# Patient Record
Sex: Male | Born: 1935 | Race: White | Hispanic: No | Marital: Married | Smoking: Former smoker
Health system: Southern US, Community
[De-identification: ages and names within clinical notes are randomized; demographics above are authoritative.]

## PROBLEM LIST (undated history)

## (undated) DIAGNOSIS — I714 Abdominal aortic aneurysm, without rupture, unspecified: Secondary | ICD-10-CM

## (undated) DIAGNOSIS — G479 Sleep disorder, unspecified: Secondary | ICD-10-CM

## (undated) DIAGNOSIS — I495 Sick sinus syndrome: Secondary | ICD-10-CM

## (undated) DIAGNOSIS — I4891 Unspecified atrial fibrillation: Secondary | ICD-10-CM

## (undated) DIAGNOSIS — I499 Cardiac arrhythmia, unspecified: Secondary | ICD-10-CM

## (undated) DIAGNOSIS — I10 Essential (primary) hypertension: Secondary | ICD-10-CM

## (undated) DIAGNOSIS — E7800 Pure hypercholesterolemia, unspecified: Secondary | ICD-10-CM

## (undated) HISTORY — PX: TIBIA FRACTURE SURGERY: SHX806

## (undated) HISTORY — DX: Unspecified atrial fibrillation: I48.91

## (undated) HISTORY — DX: Abdominal aortic aneurysm, without rupture: I71.4

## (undated) HISTORY — DX: Abdominal aortic aneurysm, without rupture, unspecified: I71.40

## (undated) HISTORY — DX: Sleep disorder, unspecified: G47.9

## (undated) HISTORY — DX: Cardiac arrhythmia, unspecified: I49.9

## (undated) HISTORY — DX: Pure hypercholesterolemia, unspecified: E78.00

## (undated) HISTORY — PX: TONSILLECTOMY: SUR1361

## (undated) HISTORY — DX: Sick sinus syndrome: I49.5

## (undated) HISTORY — DX: Essential (primary) hypertension: I10

---
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
## 2003-01-05 ENCOUNTER — Encounter: Admission: RE | Admit: 2003-01-05 | Discharge: 2003-01-05 | Payer: Self-pay | Admitting: Neurosurgery

## 2003-01-05 ENCOUNTER — Encounter: Payer: Self-pay | Admitting: Neurosurgery

## 2003-01-23 ENCOUNTER — Encounter: Payer: Self-pay | Admitting: Neurosurgery

## 2003-01-23 ENCOUNTER — Encounter: Admission: RE | Admit: 2003-01-23 | Discharge: 2003-01-23 | Payer: Self-pay | Admitting: Neurosurgery

## 2003-02-09 ENCOUNTER — Encounter: Admission: RE | Admit: 2003-02-09 | Discharge: 2003-02-09 | Payer: Self-pay | Admitting: Neurosurgery

## 2003-02-09 ENCOUNTER — Encounter: Payer: Self-pay | Admitting: Neurosurgery

## 2007-11-08 ENCOUNTER — Ambulatory Visit: Payer: Self-pay | Admitting: Surgery

## 2007-12-13 ENCOUNTER — Encounter: Admission: RE | Admit: 2007-12-13 | Discharge: 2007-12-13 | Payer: Self-pay | Admitting: Surgery

## 2007-12-13 ENCOUNTER — Ambulatory Visit: Payer: Self-pay | Admitting: Surgery

## 2007-12-20 ENCOUNTER — Ambulatory Visit (HOSPITAL_COMMUNITY): Admission: RE | Admit: 2007-12-20 | Discharge: 2007-12-20 | Payer: Self-pay | Admitting: Ophthalmology

## 2008-05-01 ENCOUNTER — Ambulatory Visit (HOSPITAL_COMMUNITY): Admission: RE | Admit: 2008-05-01 | Discharge: 2008-05-01 | Payer: Self-pay | Admitting: Ophthalmology

## 2008-05-10 ENCOUNTER — Ambulatory Visit: Payer: Self-pay | Admitting: Surgery

## 2008-06-19 ENCOUNTER — Ambulatory Visit: Payer: Self-pay | Admitting: Surgery

## 2008-06-19 ENCOUNTER — Encounter: Admission: RE | Admit: 2008-06-19 | Discharge: 2008-06-19 | Payer: Self-pay | Admitting: Surgery

## 2008-12-11 ENCOUNTER — Ambulatory Visit: Payer: Self-pay | Admitting: Surgery

## 2009-06-11 ENCOUNTER — Encounter: Admission: RE | Admit: 2009-06-11 | Discharge: 2009-06-11 | Payer: Self-pay | Admitting: Surgery

## 2009-06-11 ENCOUNTER — Ambulatory Visit: Payer: Self-pay | Admitting: Surgery

## 2009-09-25 ENCOUNTER — Ambulatory Visit: Payer: Self-pay | Admitting: Cardiology

## 2009-09-26 ENCOUNTER — Encounter: Payer: Self-pay | Admitting: Cardiology

## 2009-09-28 ENCOUNTER — Encounter: Payer: Self-pay | Admitting: Cardiology

## 2009-10-04 ENCOUNTER — Encounter (INDEPENDENT_AMBULATORY_CARE_PROVIDER_SITE_OTHER): Payer: Self-pay | Admitting: *Deleted

## 2009-10-04 ENCOUNTER — Ambulatory Visit: Payer: Self-pay | Admitting: Internal Medicine

## 2009-10-04 ENCOUNTER — Encounter: Payer: Self-pay | Admitting: Cardiology

## 2009-10-04 DIAGNOSIS — I714 Abdominal aortic aneurysm, without rupture, unspecified: Secondary | ICD-10-CM | POA: Insufficient documentation

## 2009-10-04 DIAGNOSIS — I10 Essential (primary) hypertension: Secondary | ICD-10-CM | POA: Insufficient documentation

## 2009-10-05 ENCOUNTER — Telehealth (INDEPENDENT_AMBULATORY_CARE_PROVIDER_SITE_OTHER): Payer: Self-pay | Admitting: *Deleted

## 2009-10-10 ENCOUNTER — Telehealth: Payer: Self-pay | Admitting: Internal Medicine

## 2009-10-12 ENCOUNTER — Ambulatory Visit: Payer: Self-pay | Admitting: Cardiology

## 2009-10-15 ENCOUNTER — Encounter: Payer: Self-pay | Admitting: Cardiology

## 2009-10-17 ENCOUNTER — Encounter: Payer: Self-pay | Admitting: Cardiology

## 2009-10-23 ENCOUNTER — Encounter: Payer: Self-pay | Admitting: Internal Medicine

## 2009-10-31 ENCOUNTER — Telehealth (INDEPENDENT_AMBULATORY_CARE_PROVIDER_SITE_OTHER): Payer: Self-pay | Admitting: *Deleted

## 2009-11-28 ENCOUNTER — Ambulatory Visit: Payer: Self-pay | Admitting: Internal Medicine

## 2009-12-03 ENCOUNTER — Telehealth: Payer: Self-pay | Admitting: Internal Medicine

## 2009-12-05 ENCOUNTER — Ambulatory Visit: Payer: Self-pay | Admitting: Internal Medicine

## 2009-12-05 ENCOUNTER — Encounter: Payer: Self-pay | Admitting: Cardiology

## 2009-12-05 LAB — CONVERTED CEMR LAB
Basophils Relative: 0.7 % (ref 0.0–3.0)
Calcium: 9.7 mg/dL (ref 8.4–10.5)
Eosinophils Absolute: 0.1 10*3/uL (ref 0.0–0.7)
Eosinophils Relative: 2.4 % (ref 0.0–5.0)
Hemoglobin: 14.3 g/dL (ref 13.0–17.0)
INR: 1.1 — ABNORMAL HIGH (ref 0.8–1.0)
Lymphs Abs: 1.4 10*3/uL (ref 0.7–4.0)
MCHC: 32.8 g/dL (ref 30.0–36.0)
MCV: 102.2 fL — ABNORMAL HIGH (ref 78.0–100.0)
Monocytes Relative: 10.1 % (ref 3.0–12.0)
Neutro Abs: 3.7 10*3/uL (ref 1.4–7.7)
Neutrophils Relative %: 62.9 % (ref 43.0–77.0)
Platelets: 176 10*3/uL (ref 150.0–400.0)
Prothrombin Time: 11.3 s (ref 9.1–11.7)
RDW: 12.9 % (ref 11.5–14.6)
WBC: 5.8 10*3/uL (ref 4.5–10.5)

## 2009-12-06 ENCOUNTER — Encounter: Payer: Self-pay | Admitting: Internal Medicine

## 2009-12-07 ENCOUNTER — Telehealth: Payer: Self-pay | Admitting: Internal Medicine

## 2009-12-10 ENCOUNTER — Ambulatory Visit (HOSPITAL_COMMUNITY): Admission: RE | Admit: 2009-12-10 | Discharge: 2009-12-11 | Payer: Self-pay | Admitting: Internal Medicine

## 2009-12-10 ENCOUNTER — Ambulatory Visit: Payer: Self-pay | Admitting: Internal Medicine

## 2009-12-10 HISTORY — PX: PACEMAKER INSERTION: SHX728

## 2009-12-11 ENCOUNTER — Encounter: Payer: Self-pay | Admitting: Cardiology

## 2009-12-11 ENCOUNTER — Encounter: Payer: Self-pay | Admitting: Internal Medicine

## 2009-12-12 ENCOUNTER — Encounter: Payer: Self-pay | Admitting: Internal Medicine

## 2009-12-12 ENCOUNTER — Telehealth (INDEPENDENT_AMBULATORY_CARE_PROVIDER_SITE_OTHER): Payer: Self-pay | Admitting: *Deleted

## 2009-12-21 ENCOUNTER — Ambulatory Visit: Payer: Self-pay | Admitting: Internal Medicine

## 2010-01-21 ENCOUNTER — Ambulatory Visit: Payer: Self-pay | Admitting: Cardiology

## 2010-01-28 ENCOUNTER — Ambulatory Visit: Payer: Self-pay | Admitting: Surgery

## 2010-01-28 ENCOUNTER — Encounter: Admission: RE | Admit: 2010-01-28 | Discharge: 2010-01-28 | Payer: Self-pay | Admitting: Surgery

## 2010-03-15 ENCOUNTER — Telehealth (INDEPENDENT_AMBULATORY_CARE_PROVIDER_SITE_OTHER): Payer: Self-pay | Admitting: *Deleted

## 2010-05-10 ENCOUNTER — Ambulatory Visit: Payer: Self-pay | Admitting: Internal Medicine

## 2010-06-04 ENCOUNTER — Encounter: Payer: Self-pay | Admitting: Cardiology

## 2010-07-26 ENCOUNTER — Encounter: Admission: RE | Admit: 2010-07-26 | Discharge: 2010-07-26 | Payer: Self-pay | Admitting: Surgery

## 2010-07-29 ENCOUNTER — Ambulatory Visit: Payer: Self-pay | Admitting: Surgery

## 2010-07-29 ENCOUNTER — Encounter: Payer: Self-pay | Admitting: Cardiology

## 2010-07-29 ENCOUNTER — Encounter: Admission: RE | Admit: 2010-07-29 | Discharge: 2010-07-29 | Payer: Self-pay | Admitting: Surgery

## 2010-08-13 ENCOUNTER — Ambulatory Visit: Payer: Self-pay | Admitting: Cardiology

## 2010-12-31 NOTE — Procedures (Signed)
Summary: wound check   Current Medications (verified): 1)  Fluticasone Propionate 50 Mcg/act Susp (Fluticasone Propionate) .... Take As Directed 2)  Multivitamins  Tabs (Multiple Vitamin) .... Once Daily 3)  Diovan 320 Mg Tabs (Valsartan) .... Take One Tablet Pm 4)  Lipitor 10 Mg Tabs (Atorvastatin Calcium) .... Take One Tablet Once Daily 5)  Aspirin 81 Mg Tabs (Aspirin) .... Once Daily 6)  Hydrochlorothiazide 25 Mg Tabs (Hydrochlorothiazide) .... 1/2 By Mouth Daily  Allergies (verified): No Known Drug Allergies  PPM Specifications Following MD:  Hillis Range, MD     PPM Vendor:  [employer]onic     PPM Model Number:  ADDRL1     PPM Serial Number:  [id number] PPM DOI:  12/10/2009     PPM Implanting MD:  Sherryl Manges, MD  Lead 1    Location: RA     DOI: 12/10/2009     Model #: [id number]     Serial #: [id number]     Status: active Lead 2    Location: RV     DOI: 12/10/2009     Model #: [id number]     Serial #: [id number]     Status: active  Magnet Response Rate:  BOL 85 ERI  65  Indications:  Neurocardiogenic syncope    PPM Follow Up Remote Check?  No Battery Voltage:  2.79 V     Battery Est. Longevity:  9 YEARS     Pacer Dependent:  No       PPM Device Measurements Atrium  Amplitude: 2.8 mV, Impedance: 584 ohms, Threshold: 0.375 V at 0.4 msec Right Ventricle  Amplitude: 22.4 mV, Impedance: 549 ohms, Threshold: 0.5 V at 0.4 msec  Episodes MS Episodes:  0     Ventricular High Rate:  0     Atrial Pacing:  58.3%     Ventricular Pacing:  4%  Parameters Mode:  MVP (R)     Lower Rate Limit:  60     Upper Rate Limit:  130 Paced AV Delay:  150     Sensed AV Delay:  120 Next Cardiology Appt Due:  03/01/2010 Tech Comments:  Wound check appt today.  Steri-strips removed.  Wound without redness or edema.  Normal device function.  No changes made today.  ROV 3 months Dr Johney Frame in [place]. Gypsy Balsam RN BSN  December 21, 2009 10:02 AM

## 2010-12-31 NOTE — Progress Notes (Signed)
Summary: snow on monday, procedure   Phone Note Call from Patient Call back at [facility] Phone [phone]   Caller: Patient Reason for Call: Talk to Nurse Details for Reason: due to snow on monday, what would be the step regarding his procedure if it cancel.  Initial call taken by: Lorne Skeens,  December 07, 2009 12:44 PM  Follow-up for Phone Call        Called patient and left message on machine To advise pt that nothing changes at the hospital, Dr. [name] will be there and the lab will run as scheduled.  Follow-up by: Duncan Dull, RN, BSN,  December 07, 2009 2:14 PM

## 2010-12-31 NOTE — Assessment & Plan Note (Signed)
Summary: 3 MO FU -SRS   Visit Type:  Pacemaker check Primary Provider:  [name]  CC:  pacemaker check .  History of Present Illness: The patient presents today for routine electrophysiology followup. He reports doing very well since having his pacemaker implanted. The patient denies symptoms of palpitations, chest pain, shortness of breath, orthopnea, PND, lower extremity edema, dizziness, presyncope, syncope, or neurologic sequela. The patient is tolerating medications without difficulties and is otherwise without complaint today.   Preventive Screening-Counseling & Management  Alcohol-Tobacco     Smoking Status: quit     Year Started: 2010  Current Medications (verified): 1)  Fluticasone Propionate 50 Mcg/act Susp (Fluticasone Propionate) .... Take As Directed 2)  Multivitamins  Tabs (Multiple Vitamin) .... Once Daily 3)  Diovan 320 Mg Tabs (Valsartan) .... Take One Tablet Pm 4)  Lipitor 10 Mg Tabs (Atorvastatin Calcium) .... Take One Tablet Once Daily 5)  Aspirin 81 Mg Tabs (Aspirin) .... Once Daily 6)  Hydrochlorothiazide 25 Mg Tabs (Hydrochlorothiazide) .... 1/2 By Mouth Daily  Allergies (verified): No Known Drug Allergies  Comments:  Nurse/Medical Assistant: The patient's medications were reviewed with the patient and were updated in the Medication List.  Past History:  Past Medical History:  Hypertension.   Hypercholesterolemia.   Cardiac arrhythmia. Prob ANVRT AAA Sleep disordered breathing  sinus node dysfunction  Past Surgical History:  Appendectomy.  tonsillecomy leg fracture repair  pacemaker implantation 1/11 by Dr [name]  Vital Signs:  Patient profile:   75 year old male Height:      70 inches Weight:      202.8 pounds O2 Sat:      97 % on Room air Pulse rate:   77 / minute BP sitting:   98 / 65  (left arm)  Vitals Entered By: Youlanda Mighty RN (May 10, 2010 3:32 PM)  O2 Flow:  Room air CC: pacemaker check    Physical Exam  General:   Well developed, well nourished, in no acute distress. Head:  normocephalic and atraumatic Eyes:  PERRLA/EOM intact; conjunctiva and lids normal. Mouth:  Teeth, gums and palate normal. Oral mucosa normal. Neck:  supple Chest Wall:  well healed Lungs:  clear Heart:  RRR Abdomen:  Bowel sounds positive; abdomen soft and non-tender without masses, organomegaly, or hernias noted. No hepatosplenomegaly. Msk:  Back normal, normal gait. Muscle strength and tone normal. Pulses:  pulses normal in all 4 extremities Extremities:  No clubbing or cyanosis. Neurologic:  Alert and oriented x 3.   PPM Specifications Following MD:  Hillis Range, MD     PPM Vendor:  [employer]tronic     PPM Model Number:  ADDRL1     PPM Serial Number:  [id number] PPM DOI:  12/10/2009     PPM Implanting MD:  Sherryl Manges, MD  Lead 1    Location: RA     DOI: 12/10/2009     Model #: [id number]     Serial #: [id number]     Status: active Lead 2    Location: RV     DOI: 12/10/2009     Model #: [id number]     Serial #: [id number]     Status: active  Magnet Response Rate:  BOL 85 ERI  65  Indications:  Neurocardiogenic syncope    PPM Follow Up Remote Check?  No Battery Voltage:  2.79 V     Battery Est. Longevity:  14 years     Pacer Dependent:  No  PPM Device Measurements Atrium  Amplitude: 2.8 mV, Impedance: 532 ohms, Threshold: 0.375 V at 0.4 msec Right Ventricle  Amplitude: 22.40 mV, Impedance: 557 ohms, Threshold: 0.625 V at 0.4 msec  Episodes MS Episodes:  109     Percent Mode Switch:  <0.1%     Coumadin:  No Ventricular High Rate:  10     Atrial Pacing:  51.6%     Ventricular Pacing:  3.3%  Parameters Mode:  MVP (R)     Lower Rate Limit:  60     Upper Rate Limit:  130 Paced AV Delay:  150     Sensed AV Delay:  120 Next Cardiology Appt Due:  12/01/2010 Tech Comments:  No parameter changes. Device function normal.  10 VHR episodes all 6 seconds or less.  ROV 1/12 with Dr. Johney Frame in [place]. Altha Harm, LPN  May 10, 2010  3:48 PM  MD Comments:  agree  Impression & Recommendations:  Problem # 1:  SINOATRIAL NODE DYSFUNCTION (ICD-427.81) normal pacemaker function no changes today  Patient Instructions: 1)  return 1/12

## 2010-12-31 NOTE — Cardiovascular Report (Signed)
Summary: Pre Op Orders  Pre Op Orders   Imported By: Roderic Ovens 12/21/2009 16:12:31  _____________________________________________________________________  External Attachment:    Type:   Image     Comment:   External Document

## 2010-12-31 NOTE — Miscellaneous (Signed)
Summary: Device preload  Clinical Lists Changes  Observations: Added new observation of PPM INDICATN: Neurocardiogenic syncope  (12/12/2009 11:50) Added new observation of MAGNET RTE: BOL 85 ERI  65 (12/12/2009 11:50) Added new observation of PPMLEADSTAT2: active (12/12/2009 11:50) Added new observation of PPMLEADSER2: [id number] (12/12/2009 11:50) Added new observation of PPMLEADMOD2: 6948NI (12/12/2009 11:50) Added new observation of PPMLEADLOC2: RV (12/12/2009 11:50) Added new observation of PPMLEADSTAT1: active (12/12/2009 11:50) Added new observation of PPMLEADSER1: [id number] (12/12/2009 11:50) Added new observation of PPMLEADMOD1: 5076  (12/12/2009 11:50) Added new observation of PPMLEADLOC1: RA  (12/12/2009 11:50) Added new observation of PPM IMP MD: Sherryl Manges, MD  (12/12/2009 11:50) Added new observation of PPMLEADDOI2: 12/10/2009  (12/12/2009 11:50) Added new observation of PPMLEADDOI1: 12/10/2009  (12/12/2009 11:50) Added new observation of PPM DOI: 12/10/2009  (12/12/2009 11:50) Added new observation of PPM SERL#: [id number]  (12/12/2009 11:50) Added new observation of PPM MODL#: ADDRL1  (12/12/2009 69:67) Added new observation of PACEMAKERMFG: [employer]tr[employer]  (12/12/2009 11:50) Added new observation of PACEMAKER MD: Sherryl Manges, MD  (12/12/2009 11:50)      PPM Specifications Following MD:  Sherryl Manges, MD     PPM Vendor:  [employer]     PPM Model Number:  ADDRL1     PPM Serial Number:  [id number] H PPM DOI:  12/10/2009     PPM Implanting MD:  Sherryl Manges, MD  Lead 1    Location: RA     DOI: 12/10/2009     Model #: 1025     Serial #: [id number]     Status: active Lead 2    Location: RV     DOI: 12/10/2009     Model #: [id number]     Serial #: [id number]     Status: active  Magnet Response Rate:  BOL 85 ERI  65  Indications:  Neurocardiogenic syncope

## 2010-12-31 NOTE — Progress Notes (Signed)
Summary: pacer question  Phone Note Call from Patient   Caller: wife - [place] Summary of Call: Had pacemaker placed on Monday by Dr. [name].  Now having slight swelling in left hand and wrist.  No other complaints.   Hoover Brunette, LPN  December 12, 2009 3:05 PM   Follow-up for Phone Call        Spoke with patient, some mild swelling in hand around knuckles and wrist area.  Pt is moving arm but not raising it.  He has an appt in [place] device clinic next Friday 1-21.  Pt advised to monitor swelling, to move arm as much as allowed, and to call back if swelling progresses.  Pt aware and agrees with plan. Gypsy Balsam RN BSN  December 12, 2009 3:33 PM

## 2010-12-31 NOTE — Letter (Signed)
Summary: External Correspondence/ OFFICE VISIT VASCULAR & VEIN  External Correspondence/ OFFICE VISIT VASCULAR & VEIN   Imported By: Dorise Hiss 08/26/2010 12:00:52  _____________________________________________________________________  External Attachment:    Type:   Image     Comment:   External Document

## 2010-12-31 NOTE — Cardiovascular Report (Signed)
Summary: Card Device Clinic/ FINAL REPORT  Card Device Clinic/ FINAL REPORT   Imported By: Dorise Hiss 05/14/2010 15:03:38  _____________________________________________________________________  External Attachment:    Type:   Image     Comment:   External Document

## 2010-12-31 NOTE — Assessment & Plan Note (Signed)
Summary: [facility] PER FAX 6 WK FU -SRS   Visit Type:  hospital follow-up Primary Provider:  [name]  CC:  hospital follow-up visit.  History of Present Illness: the patient is 75 year old male with a history of bradycardia incidentally noted during colonoscopy. However in followup the patient had significant pauses and marked bradycardia. The patient was referred for a pacemaker. Dr. [name] initially felt that this was related alterations in autonomic tone due to sleep apnea. However a [employer] study  was rather unremarkable and there was certainly no no evidence that this was the main cause for the bradycardia. the patient occasionally has mild shortness of breath. He also reports a stiffness in his left neck area which is intermittent but not suggestive of angina. It also appears to be positional in state that this occurred after his pacemaker implantation. He takes Tylenol his symptoms improve.  The patient is followed by Dr. [name] for aortic aneurysm which is approaching surgical repair size. Is not clear whether the patient will benefit from an endograft versus an open surgical procedure. Apparently is in the process of having this evaluated.  The patient also explained go to his chiropractor and wants to know if it's safe to use the TENS unit. We actually called [facility] and they confirmed that there was no contraindication.  Preventive Screening-Counseling & Management  Alcohol-Tobacco     Smoking Status: quit     Year Quit: 08/2009  Current Problems (verified): 1)  Svt  (ICD-427.0) 2)  Sinoatrial Node Dysfunction  (ICD-427.81) 3)  Hypertension, Benign  (ICD-401.1) 4)  Aaa  (ICD-441.4)  Current Medications (verified): 1)  Fluticasone Propionate 50 Mcg/act Susp (Fluticasone Propionate) .... Take As Directed 2)  Multivitamins  Tabs (Multiple Vitamin) .... Once Daily 3)  Diovan 320 Mg Tabs (Valsartan) .... Take One Tablet Pm 4)  Lipitor 10 Mg Tabs (Atorvastatin Calcium) ....  Take One Tablet Once Daily 5)  Aspirin 81 Mg Tabs (Aspirin) .... Once Daily 6)  Hydrochlorothiazide 25 Mg Tabs (Hydrochlorothiazide) .... 1/2 By Mouth Daily  Allergies (verified): No Known Drug Allergies  Comments:  Nurse/Medical Assistant: The patient's medications and allergies were reviewed with the patient and were updated in the Medication and Allergy Lists. List reviewed.  Past History:  Past Medical History: Last updated: 10/04/2009  Hypertension.   Hypercholesterolemia.   Cardiac arrhythmia. Prob ANVRT AAA Sleep disordered breathing  Past Surgical History: Last updated: 10/04/2009  Appendectomy.  tonsillecomy leg fracture repair     Family History: Last updated: 10/04/2009  Positive for his father having an MI at age 61.   Social History: Last updated: 10/04/2009   He smokes approximately 1-2 cigarettes per day.  He is   trying to quit.     Married  Retired  Alcohol Use - yes  Risk Factors: Smoking Status: quit (01/21/2010)  Social History: Smoking Status:  quit  Review of Systems  The patient denies fatigue, malaise, fever, weight gain/loss, vision loss, decreased hearing, hoarseness, chest pain, palpitations, shortness of breath, prolonged cough, wheezing, sleep apnea, coughing up blood, abdominal pain, blood in stool, nausea, vomiting, diarrhea, heartburn, incontinence, blood in urine, muscle weakness, joint pain, leg swelling, rash, skin lesions, headache, fainting, dizziness, depression, anxiety, enlarged lymph nodes, easy bruising or bleeding, and environmental allergies.    Vital Signs:  Patient profile:   75 year old male Height:      70 inches Weight:      196 pounds Pulse rate:   79 / minute BP sitting:  107 / 70  (left arm) Cuff size:   regular  Vitals Entered By: [name] (January 21, 2010 1:01 PM) CC: hospital follow-up visit   Physical Exam  Additional Exam:  General: Well-developed, well-nourished in no distress head:  Normocephalic and atraumatic eyes PERRLA/EOMI intact, conjunctiva and lids normal nose: No deformity or lesions mouth normal dentition, normal posterior pharynx neck: Supple, no JVD.  No masses, thyromegaly or abnormal cervical nodes. No point tenderness in the neck area no evidence of scoliosis. lungs: Normal breath sounds bilaterally without wheezing.  Normal percussion heart: regular rate and rhythm with normal S1 and S2, no S3 or S4.  PMI is normal.  No pathological murmurs abdomen: Normal bowel sounds, abdomen is soft and nontender without masses, organomegaly or hernias noted.  No hepatosplenomegaly musculoskeletal: Back normal, normal gait muscle strength and tone normal pulsus: Pulse is normal in all 4 extremities Extremities: No peripheral pitting edema neurologic: Alert and oriented x 3 skin: Intact without lesions or rashes cervical nodes: No significant adenopathy psychologic: Normal affect    PPM Specifications Following MD:  Hillis Range, MD     PPM Vendor:  [employer]     PPM Model Number:  ADDRL1     PPM Serial Number:  [id number] PPM DOI:  12/10/2009     PPM Implanting MD:  Sherryl Manges, MD  Lead 1    Location: RA     DOI: 12/10/2009     Model #: 5621     Serial #: [id number]     Status: active Lead 2    Location: RV     DOI: 12/10/2009     Model #: 6295MW     Serial #: [id number]     Status: active  Magnet Response Rate:  BOL 85 ERI  65  Indications:  Neurocardiogenic syncope    PPM Follow Up Pacer Dependent:  No      Parameters Mode:  MVP (R)     Lower Rate Limit:  60     Upper Rate Limit:  130 Paced AV Delay:  150     Sensed AV Delay:  120  Impression & Recommendations:  Problem # 1:  SINOATRIAL NODE DYSFUNCTION (ICD-427.81) the patient status post pacemaker implantation. He is currently in a paced rhythm. However he is not pacemaker dependent. I cleared him to use a TENS unit. His updated medication list for this problem includes:    Aspirin 81 Mg Tabs  (Aspirin) ..... Once daily  Orders: EKG w/ Interpretation (93000)  Problem # 2:  HYPERTENSION, BENIGN (ICD-401.1) Assessment: Comment Only patient blood pressure well-controlled. we will have to keep the blood pressure low to reduce the risk of AAA rupture. His updated medication list for this problem includes:    Diovan 320 Mg Tabs (Valsartan) .Marland Kitchen... Take one tablet pm    Aspirin 81 Mg Tabs (Aspirin) ..... Once daily    Hydrochlorothiazide 25 Mg Tabs (Hydrochlorothiazide) .Marland Kitchen... 1/2 by mouth daily  Problem # 3:  AAA (ICD-441.4) the patient will need a stress test prior to an open surgical procedure. However if an endograft explained that I do not think the patient needs a Cardiolite  Problem # 4:  HYPERTENSION, BENIGN (ICD-401.1)  Patient Instructions: 1)  Your physician recommends that you continue on your current medications as directed. Please refer to the Current Medication list given to you today. 2)  Follow up in  6 months.

## 2010-12-31 NOTE — Progress Notes (Signed)
Summary: ? need for ABO prior to dental procedure      Phone Note Call from Patient   Summary of Call: Need to know if needs ABO prior to dental treatment.  Dr. Freddi Starr  Left message to return call.  Hoover Brunette, LPN  March 15, 2010 12:03 PM    Follow-up for Phone Call        Waiting on answer from [place] for routine cleaning.  Does not look like he needs ABO prior.   Hoover Brunette, LPN  March 15, 2010 4:13 PM   Additional Follow-up for Phone Call Additional follow up Details #1::        Does NOT need SBE prophylaxis. Lewayne Bunting, MD, [facility]  March 19, 2010 12:59 PM  Left message to return call.  Hoover Brunette, LPN  March 19, 2010 2:37 PM  Patient notified.   Hoover Brunette, LPN  March 19, 2010 3:48 PM

## 2010-12-31 NOTE — Progress Notes (Signed)
Summary: [facility] OFFICE VISIT DR. Delphia Grates OFFICE VISIT DR. [name]   Imported By: Zachary George 08/13/2010 10:08:20  _____________________________________________________________________  External Attachment:    Type:   Image     Comment:   External Document

## 2010-12-31 NOTE — Cardiovascular Report (Signed)
Summary: Office Visit   Office Visit   Imported By: Roderic Ovens 01/08/2010 15:42:31  _____________________________________________________________________  External Attachment:    Type:   Image     Comment:   External Document

## 2010-12-31 NOTE — Progress Notes (Signed)
  Phone Note Call from Patient Call back at Home Phone [phone]   Caller: Patient Reason for Call: Talk to Nurse Summary of Call: pt would like to schedule to have his pacemaker put in. Initial call taken by: Edman Circle,  December 03, 2009 11:50 AM  Follow-up for Phone Call        pt calling again, Migdalia Dk  December 03, 2009 1:59 PM   Unable to reach pt. Phone busy Duncan Dull, RN, BSN  December 04, 2009 10:02 AM   Additional Follow-up for Phone Call Additional follow up Details #1::        Patient called again to give information and make arrangements for procedure.  Would like a call back today.  [phone]  Pt scheduled for Implant on Dec 10, 2009 at 0800.  Additional Follow-up by: Burnard Leigh,  December 04, 2009 10:48 AM

## 2010-12-31 NOTE — Assessment & Plan Note (Signed)
Summary: 6 mo fu per aug reminder   Visit Type:  Follow-up Primary Provider:  Sherril Croon   History of Present Illness: the patient is a 75 year old male with a history of pacemaker implantation as well as AAA that is followed by vascular surgery.  The patient has a history of hypertension.  His blood pressure well controlled.  He denies any chest pain.  He has no orthopnea PND palpitations or syncope.  Preventive Screening-Counseling & Management  Alcohol-Tobacco     Smoking Status: quit     Year Quit: 2010  Current Medications (verified): 1)  Nasonex 50 Mcg/act Susp (Mometasone Furoate) .Marland Kitchen.. 1-2 Sprays/nostils Daily 2)  Multivitamins  Tabs (Multiple Vitamin) .... Once Daily 3)  Diovan 320 Mg Tabs (Valsartan) .... Take One Tablet Pm 4)  Lipitor 10 Mg Tabs (Atorvastatin Calcium) .... Take One Tablet Once Daily 5)  Aspirin 81 Mg Tabs (Aspirin) .... Once Daily 6)  Hydrochlorothiazide 25 Mg Tabs (Hydrochlorothiazide) .... 1/2 By Mouth Daily 7)  Astepro 0.15 % Soln (Azelastine Hcl) .... 2 Sprays/nostril Daily 8)  Tamsulosin Hcl 0.4 Mg Caps (Tamsulosin Hcl) .... Take 1 Tablet By Mouth Once A Day 9)  Levocetirizine Dihydrochloride 5 Mg Tabs (Levocetirizine Dihydrochloride) .... Take 1 Tablet By Mouth Once A Day  Allergies (verified): No Known Drug Allergies  Comments:  Nurse/Medical Assistant: The patient's medication list and allergies were reviewed with the patient and were updated in the Medication and Allergy Lists.  Past History:  Past Medical History: Last updated: 05/10/2010  Hypertension.   Hypercholesterolemia.   Cardiac arrhythmia. Prob ANVRT AAA Sleep disordered breathing  sinus node dysfunction  Past Surgical History: Last updated: 05/10/2010  Appendectomy.  tonsillecomy leg fracture repair  pacemaker implantation 1/11 by Dr [name]  Family History: Last updated: 10/04/2009  Positive for his father having an MI at age 40.   Social History: Last updated:  10/04/2009   He smokes approximately 1-2 cigarettes per day.  He is   trying to quit.     Married  Retired  Alcohol Use - yes  Risk Factors: Smoking Status: quit (08/13/2010)  Vital Signs:  Patient profile:   75 year old male Height:      70 inches Weight:      202 pounds O2 Sat:      97 % on Room air Pulse rate:   68 / minute BP sitting:   117 / 77  (left arm) Cuff size:   regular  Vitals Entered By: [name] (August 13, 2010 10:46 AM)  O2 Flow:  Room air  Physical Exam  Additional Exam:  General: Well-developed, well-nourished in no distress head: Normocephalic and atraumatic eyes PERRLA/EOMI intact, conjunctiva and lids normal nose: No deformity or lesions mouth normal dentition, normal posterior pharynx neck: Supple, no JVD.  No masses, thyromegaly or abnormal cervical nodes. No point tenderness in the neck area no evidence of scoliosis. lungs: Normal breath sounds bilaterally without wheezing.  Normal percussion heart: regular rate and rhythm with normal S1 and S2, no S3 or S4.  PMI is normal.  No pathological murmurs abdomen: Normal bowel sounds, abdomen is soft and nontender without masses, organomegaly or hernias noted.  No hepatosplenomegaly musculoskeletal: Back normal, normal gait muscle strength and tone normal pulsus: Pulse is normal in all 4 extremities Extremities: No peripheral pitting edema neurologic: Alert and oriented x 3 skin: Intact without lesions or rashes cervical nodes: No significant adenopathy psychologic: Normal affect    PPM Specifications Following MD:  Hillis Range,  MD     PPM Vendor:  Med[employer]     PPM Model Number:  ADDRL1     PPM Serial Number:  [id number] PPM DOI:  12/10/2009     PPM Implanting MD:  Sherryl Manges, MD  Lead 1    Location: [facility]     DOI: 12/10/2009     Model #: [id number]     Serial #: [id number]     Status: active Lead 2    Location: RV     DOI: 12/10/2009     Model #: [id number]     Serial #: [id number]     Status:  active  Magnet Response Rate:  BOL 85 ERI  65  Indications:  Neurocardiogenic syncope    PPM Follow Up Pacer Dependent:  No      Episodes Coumadin:  No  Parameters Mode:  MVP (R)     Lower Rate Limit:  60     Upper Rate Limit:  130 Paced AV Delay:  150     Sensed AV Delay:  120  Impression & Recommendations:  Problem # 1:  SINOATRIAL NODE DYSFUNCTION (ICD-427.81) the patient is status post pacemaker implantation. His updated medication list for this problem includes:    Aspirin 81 Mg Tabs (Aspirin) ..... Once daily    Metoprolol Succinate 25 Mg Xr24h-tab (Metoprolol succinate) .Marland Kitchen... Take 1 tablet by mouth once a day  Problem # 2:  AAA (ICD-441.4) we will start low-dose beta-blocker.  The patient will likely need an open surgical procedure.  At that time we will proceed with Cardiolite stress testing  Problem # 3:  HYPERTENSION, BENIGN (ICD-401.1) blood pressure is controlled.  The reaffirmed to the patient to continue taking his hydrochlorothiazide. His updated medication list for this problem includes:    Diovan 320 Mg Tabs (Valsartan) .Marland Kitchen... Take one tablet pm    Aspirin 81 Mg Tabs (Aspirin) ..... Once daily    Hydrochlorothiazide 25 Mg Tabs (Hydrochlorothiazide) .Marland Kitchen... 1/2 by mouth daily    Metoprolol Succinate 25 Mg Xr24h-tab (Metoprolol succinate) .Marland Kitchen... Take 1 tablet by mouth once a day  Patient Instructions: 1)  Your physician has recommended you make the following change in your medication: START METOPROLOL SUCCINATE 25MG . This medication has been sent to your pharmacy. 2)  Your physician wants you to follow-up in: 6 months. You will receive a reminder letter in the mail about two months in advance. If you don't receive a letter, please call our office to schedule the follow-up appointment. Prescriptions: METOPROLOL SUCCINATE 25 MG XR24H-TAB (METOPROLOL SUCCINATE) Take 1 tablet by mouth once a day  #30 x 6   Entered by:   [name]   Authorized by:   Lewayne Bunting,  MD, [facility]   Signed by:   [name] on 08/13/2010   Method used:   Electronically to        [facility]* (retail)       [address]       [place], [place]  [address]       Ph: [phone]       Fax: [phone]   RxID:   [id number]

## 2011-02-03 ENCOUNTER — Encounter: Payer: Self-pay | Admitting: Internal Medicine

## 2011-02-03 ENCOUNTER — Encounter (INDEPENDENT_AMBULATORY_CARE_PROVIDER_SITE_OTHER): Payer: Medicare HMO | Admitting: Internal Medicine

## 2011-02-03 DIAGNOSIS — I495 Sick sinus syndrome: Secondary | ICD-10-CM

## 2011-02-11 NOTE — Assessment & Plan Note (Signed)
Summary: Omar Morales /LG   Visit Type:  Pacemaker check Primary Provider:  [name]   History of Present Illness: The patient presents today for routine electrophysiology followup. He reports doing very well since last being seen in our clinic. The patient denies symptoms of palpitations, chest pain, shortness of breath, orthopnea, PND, lower extremity edema, dizziness, presyncope, syncope, or neurologic sequela. The patient is tolerating medications without difficulties and is otherwise without complaint today.   Preventive Screening-Counseling & Management  Alcohol-Tobacco     Smoking Status: quit     Year Quit: 2010  Current Medications (verified): 1)  Fluticasone Propionate 50 Mcg/act Susp (Fluticasone Propionate) .... 2 Sprays/nostril Daily 2)  Multivitamins  Tabs (Multiple Vitamin) .... Once Daily 3)  Lisinopril 20 Mg Tabs (Lisinopril) .... Take 1 Tablet By Mouth Once A Day 4)  Lipitor 10 Mg Tabs (Atorvastatin Calcium) .... Take One Tablet Once Daily 5)  Aspirin 81 Mg Tabs (Aspirin) .... Once Daily 6)  Hydrochlorothiazide 25 Mg Tabs (Hydrochlorothiazide) .... 1/2 By Mouth Daily 7)  Astepro 0.15 % Soln (Azelastine Hcl) .... 2 Sprays/nostril Daily 8)  Tamsulosin Hcl 0.4 Mg Caps (Tamsulosin Hcl) .... Take 1 Tablet By Mouth Once A Day 9)  Levocetirizine Dihydrochloride 5 Mg Tabs (Levocetirizine Dihydrochloride) .... Take 1 Tablet By Mouth Once A Day 10)  Metoprolol Succinate 25 Mg Xr24h-Tab (Metoprolol Succinate) .... Take 1 Tablet By Mouth Once A Day 11)  Singulair 10 Mg Tabs (Montelukast Sodium) .... Take 1 Tablet By Mouth Once A Day 12)  Lovastatin 40 Mg Tabs (Lovastatin) .... Take 1 Tablet By Mouth Once A Day  Allergies (verified): No Known Drug Allergies  Comments:  Nurse/Medical Assistant: The patient's medication bottles and allergies were reviewed with the patient and were updated in the Medication and Allergy Lists.  Past History:  Past Medical History:  Hypertension.   Hypercholesterolemia.   Cardiac arrhythmia. Prob ANVRT AAA Sleep disordered breathing  sinus node dysfunction s/PPM  Past Surgical History: Reviewed history from 05/10/2010 and no changes required.  Appendectomy.  tonsillecomy leg fracture repair  pacemaker implantation 1/11 by Dr [name]  Social History: tobacco-quit    Married  Retired  Alcohol Use - yes  Vital Signs:  Patient profile:   75 year old male Height:      70 inches Weight:      201 pounds BMI:     28.94 Pulse rate:   67 / minute BP sitting:   115 / 78  (left arm) Cuff size:   large  Vitals Entered By: [name] (February 03, 2011 2:48 PM)  Nutrition Counseling: Patient's BMI is greater than 25 and therefore counseled on weight management options.  Physical Exam  General:  Well developed, well nourished, in no acute distress. Head:  normocephalic and atraumatic Eyes:  PERRLA/EOM intact; conjunctiva and lids normal. Mouth:  Teeth, gums and palate normal. Oral mucosa normal. Neck:  supple Chest Wall:  well healed pacemaker pocket Lungs:  Clear bilaterally to auscultation and percussion. Heart:  RRR, no m/r/g Abdomen:  Bowel sounds positive; abdomen soft and non-tender without masses, organomegaly, or hernias noted. No hepatosplenomegaly. Msk:  Back normal, normal gait. Muscle strength and tone normal. Extremities:  No clubbing or cyanosis. Neurologic:  Alert and oriented x 3.   PPM Specifications Following MD:  Hillis Range, MD     PPM Vendor:  [employer]     PPM Model Number:  ADDRL1     PPM Serial Number:  [id number] PPM DOI:  12/10/2009  PPM Implanting MD:  Sherryl Manges, MD  Lead 1    Location: RA     DOI: 12/10/2009     Model #: [id number]     Serial #: [id number]     Status: active Lead 2    Location: RV     DOI: 12/10/2009     Model #: [id number]     Serial #: [id number]     Status: active  Magnet Response Rate:  BOL 85 ERI  65  Indications:  Neurocardiogenic syncope    PPM Follow Up Pacer  Dependent:  No      Episodes Coumadin:  No  Parameters Mode:  MVP (R)     Lower Rate Limit:  60     Upper Rate Limit:  130 Paced AV Delay:  150     Sensed AV Delay:  120 MD Comments:  see scanned report  Impression & Recommendations:  Problem # 1:  SINOATRIAL NODE DYSFUNCTION (ICD-427.81) normal pacemaker function today atrial high rates appear to be artifact on the atrial lead.  Sensitivity was therefore adjusted today. See scanned report from [facility].  Problem # 2:  HYPERTENSION, BENIGN (ICD-401.1) at goal  Problem # 3:  AAA (ICD-441.4) follows with Dr  [name]  Patient Instructions: 1)  Your physician wants you to follow-up in: 6 months. You will receive a reminder letter in the mail one-two months in advance. If you don't receive a letter, please call our office to schedule the follow-up appointment. 2)  Your physician recommends that you continue on your current medications as directed. Please refer to the Current Medication list given to you today.

## 2011-02-18 NOTE — Cardiovascular Report (Signed)
Summary: [facility]/  FINAL REPORT  [facility]/  FINAL REPORT   Imported By: Dorise Hiss 02/11/2011 16:58:06  _____________________________________________________________________  External Attachment:    Type:   Image     Comment:   External Document

## 2011-03-14 ENCOUNTER — Ambulatory Visit (INDEPENDENT_AMBULATORY_CARE_PROVIDER_SITE_OTHER): Payer: Medicare HMO | Admitting: Cardiology

## 2011-03-14 ENCOUNTER — Encounter: Payer: Self-pay | Admitting: Cardiology

## 2011-03-14 VITALS — BP 112/71 | HR 50 | Ht 70.0 in | Wt 198.0 lb

## 2011-03-14 DIAGNOSIS — E785 Hyperlipidemia, unspecified: Secondary | ICD-10-CM | POA: Insufficient documentation

## 2011-03-14 DIAGNOSIS — I495 Sick sinus syndrome: Secondary | ICD-10-CM | POA: Insufficient documentation

## 2011-03-14 DIAGNOSIS — I498 Other specified cardiac arrhythmias: Secondary | ICD-10-CM

## 2011-03-14 DIAGNOSIS — I714 Abdominal aortic aneurysm, without rupture: Secondary | ICD-10-CM

## 2011-03-14 NOTE — Progress Notes (Signed)
HPI The patient is a 75 year old male with history of pacemaker implantation as well as AAA that is followed by vascular surgery. He has a history of hypertension. 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 Pacemaker was implanted in January 2011 by Dr . [name].  Your previous office visits we discussed that if the patient needs AAA repair he will need to be scheduled for a Cardiolite stress study. The patient denies any chest pain. He has no shortness of breath. He has a recent upper airway congestion. He reports no palpitations orthopnea PND.  No Known Allergies  Current Outpatient Prescriptions on File Prior to Visit  Medication Sig Dispense Refill  . aspirin 81 MG EC tablet Take 81 mg by mouth daily.        Marland Kitchen atorvastatin (LIPITOR) 10 MG tablet Take 10 mg by mouth daily.        Marland Kitchen azelastine (ASTELIN) 137 MCG/SPRAY nasal spray 2 sprays by Nasal route daily. Use in each nostril as directed      . fluticasone (VERAMYST) 27.5 MCG/SPRAY nasal spray 2 sprays by Nasal route daily.        . hydrochlorothiazide (HYDRODIURIL) 12.5 MG tablet Take 12.5 mg by mouth daily.        Marland Kitchen levocetirizine (XYZAL) 5 MG tablet Take 5 mg by mouth every evening.        . lovastatin (MEVACOR) 40 MG tablet Take 40 mg by mouth at bedtime.        . metoprolol succinate (TOPROL-XL) 25 MG 24 hr tablet Take 25 mg by mouth daily.        . montelukast (SINGULAIR) 10 MG tablet Take 10 mg by mouth at bedtime.        . Multiple Vitamins-Minerals (MULTIVITAMIN WITH MINERALS) tablet Take 1 tablet by mouth daily.        . Tamsulosin HCl (FLOMAX) 0.4 MG CAPS Take 0.4 mg by mouth.        . DISCONTD: lisinopril (PRINIVIL,ZESTRIL) 10 MG tablet Take 10 mg by mouth daily.          Past Medical History  Diagnosis Date  . Hypertension   . Hypercholesterolemia   . Arrhythmia     cardiac arrythmia  . AAA (abdominal aortic aneurysm)   . Sleep disorder   . Sinus node dysfunction     Past Surgical History    Procedure Date  . Appendectomy   . Tonsillectomy   . Tibia fracture surgery     repair  . Insert / replace / remove pacemaker      1/11 Dr.KLein ppm medtronic    No family history on file.  History   Social History  . Marital Status: Married    Spouse Name: N/A    Number of Children: N/A  . Years of Education: N/A   Occupational History  . Not on file.   Social History Main Topics  . Smoking status: Former Smoker    Quit date: 08/01/2009  . Smokeless tobacco: Not on file  . Alcohol Use: No  . Drug Use: No  . Sexually Active: Not on file   Other Topics Concern  . Not on file   Social History Narrative  . No narrative on file   Review of systems:Pertinent positives as outlined above. The remainder of the 18  point review of systems is negative  PHYSICAL EXAM BP 112/71  Pulse 50  Ht 5\' 10"  (1.778 m)  Wt 198 lb (89.812  kg)  BMI 28.41 kg/m2  General: Well-developed, well-nourished in no distress Head: Normocephalic and atraumatic Eyes:PERRLA/EOMI intact, conjunctiva and lids normal Ears: No deformity or lesions Mouth:normal dentition, normal posterior pharynx Neck: Supple, no JVD.  No masses, thyromegaly or abnormal cervical nodes Lungs: Normal breath sounds bilaterally without wheezing.  Normal percussion Cardiac: regular rate and rhythm with normal S1 and S2, no S3 or S4.  PMI is normal.  No pathological murmurs Abdomen: Normal bowel sounds, abdomen is soft and nontender without masses, organomegaly or hernias noted.  No hepatosplenomegaly MSK: Back normal, normal gait muscle strength and tone normal Vascular: Pulse is normal in all 4 extremities Extremities: No peripheral pitting edema Neurologic: Alert and oriented x 3 Skin: Intact without lesions or rashes Lymphatics: No significant adenopathy Psychologic: Normal affect   ECG:EKG: Atrial paced rhythm with normal ventricular conduction.  ASSESSMENT AND PLAN

## 2011-03-14 NOTE — Patient Instructions (Signed)
Continue all current medications. Your physician wants you to follow up in:  1 year.  You will receive a reminder letter in the mail one-two months in advance.  If you don't receive a letter, please call our office to schedule the follow up appointment   

## 2011-03-14 NOTE — Assessment & Plan Note (Signed)
Hyperlipidemia patient is on lovastatin and is followed by primary care physician in regards to lipid levels

## 2011-03-14 NOTE — Assessment & Plan Note (Signed)
Patient will followup with Dr. Johney Frame.

## 2011-03-14 NOTE — Assessment & Plan Note (Signed)
Patient has a scheduled appointment with Dr. [name]

## 2011-03-24 ENCOUNTER — Other Ambulatory Visit: Payer: Self-pay | Admitting: Surgery

## 2011-03-24 DIAGNOSIS — I722 Aneurysm of renal artery: Secondary | ICD-10-CM

## 2011-04-07 ENCOUNTER — Ambulatory Visit
Admission: RE | Admit: 2011-04-07 | Discharge: 2011-04-07 | Disposition: A | Discharge status: Home / Self Care | Payer: Medicare HMO | Source: Ambulatory Visit | Attending: Surgery | Admitting: Surgery

## 2011-04-07 ENCOUNTER — Ambulatory Visit (INDEPENDENT_AMBULATORY_CARE_PROVIDER_SITE_OTHER): Payer: Medicare HMO | Admitting: Surgery

## 2011-04-07 ENCOUNTER — Other Ambulatory Visit: Payer: Self-pay | Admitting: Surgery

## 2011-04-07 DIAGNOSIS — I714 Abdominal aortic aneurysm, without rupture: Secondary | ICD-10-CM

## 2011-04-07 DIAGNOSIS — R911 Solitary pulmonary nodule: Secondary | ICD-10-CM

## 2011-04-07 DIAGNOSIS — I722 Aneurysm of renal artery: Secondary | ICD-10-CM

## 2011-04-07 MED ORDER — IOHEXOL 350 MG/ML SOLN
100.0000 mL | Freq: Once | INTRAVENOUS | Status: AC | PRN
  Administered 2011-04-07: 100 mL via INTRAVENOUS

## 2011-04-07 NOTE — Assessment & Plan Note (Signed)
OFFICE VISIT  Omar Morales, NEE DOB:  1935-12-08                                       04/07/2011 ZOXWR#:[id number]  The patient comes back today for followup of his aneurysms.  He was last seen in August of 2011.  He is without abdominal pain and is doing very well at this time.  He has a pacemaker in place secondary to a 6 second pause that was diagnosed at the time of colonoscopy.  He continues to be treated for his hypertension and hypercholesterolemia.  There has been no change in his social or family history or medical or surgical history since I last saw him.  PHYSICAL EXAMINATION:  Vital signs:  Heart rate 80, blood pressure 120/74, O2 sats 98%.  General:  He is well-appearing, in no distress. HEENT:  Within normal limits.  Lungs:  Clear bilaterally. Cardiovascular:  Regular rate and rhythm.  Palpable dorsalis pedis pulses bilaterally.  Abdomen:  Soft, nontender.  Aneurysm is easily felt.  Skin:  Without rash.  DIAGNOSTIC STUDIES:  Infrarenal aneurysm with maximum diameter of 5.2 terminating above the bifurcation.  He has three right renal arteries, the lower two come off near the aortic bifurcation, somewhat prominent nodular opacity in the anterior medial right middle lobe, I cannot exclude malignancy, PET is recommended.  ASSESSMENT AND PLAN: 1. Abdominal aortic aneurysm:  Based on CT scan and previous imaging     the patient is going to require an open repair with reimplantation     with two of his right renal arteries.  For that reason, we will     continue to observe him until he reaches closer to 5.5 cm in     maximum aortic diameter.  He has been told by Dr. Andee Lineman, his     cardiologist, that he will need a stress test prior to his surgery.     He is going to come back in 6 months with an ultrasound.  I will     also scan his carotids and his popliteal arteries at that time for     preoperative evaluation. 2. Lung nodule:  I am  ordering a PET scan today to further evaluate     these lesions.  I will see him back in 6 months.    Omar Ny, MD Electronically Signed  VWB/MEDQ  D:  04/07/2011  T:  04/07/2011  Job:  [id number]  cc:   Learta Codding, MD,FACC Doreen Beam, MD

## 2011-04-11 ENCOUNTER — Encounter (HOSPITAL_COMMUNITY)
Admission: RE | Admit: 2011-04-11 | Discharge: 2011-04-11 | Disposition: A | Discharge status: Home / Self Care | Payer: Medicare HMO | Source: Ambulatory Visit | Attending: Surgery | Admitting: Surgery

## 2011-04-11 DIAGNOSIS — I714 Abdominal aortic aneurysm, without rupture, unspecified: Secondary | ICD-10-CM | POA: Insufficient documentation

## 2011-04-11 DIAGNOSIS — D1779 Benign lipomatous neoplasm of other sites: Secondary | ICD-10-CM | POA: Insufficient documentation

## 2011-04-11 DIAGNOSIS — R911 Solitary pulmonary nodule: Secondary | ICD-10-CM

## 2011-04-11 DIAGNOSIS — D3500 Benign neoplasm of unspecified adrenal gland: Secondary | ICD-10-CM | POA: Insufficient documentation

## 2011-04-11 DIAGNOSIS — I2510 Atherosclerotic heart disease of native coronary artery without angina pectoris: Secondary | ICD-10-CM | POA: Insufficient documentation

## 2011-04-11 DIAGNOSIS — K7689 Other specified diseases of liver: Secondary | ICD-10-CM | POA: Insufficient documentation

## 2011-04-11 DIAGNOSIS — J984 Other disorders of lung: Secondary | ICD-10-CM | POA: Insufficient documentation

## 2011-04-11 DIAGNOSIS — E041 Nontoxic single thyroid nodule: Secondary | ICD-10-CM | POA: Insufficient documentation

## 2011-04-11 MED ORDER — FLUDEOXYGLUCOSE F - 18 (FDG) INJECTION
16.0000 | Freq: Once | INTRAVENOUS | Status: AC | PRN
  Administered 2011-04-11: 16 via INTRAVENOUS

## 2011-04-15 NOTE — Assessment & Plan Note (Signed)
OFFICE VISIT   Omar Morales, Omar Morales  DOB:  1936/02/08                                       06/19/2008  VHQIO#:[id number]   REASON FOR VISIT:  Followup aneurysm.   HISTORY:  This is a 75 year old gentleman who I am following for an  infrarenal abdominal aortic aneurysm that was incidentally discovered  approximately 3 years ago.  He was scheduled to have a CT scan today and  come in to discuss results.  He is asymptomatic at this time.  He denies  any abdominal pain.  He denies having claudication.  He walks  approximately 2 miles per day.   PHYSICAL EXAMINATION:  Blood pressure 139/81, pulse is 58.  General:  He  is well appearing in no acute distress.  His extremities are warm and  well perfused.  Abdomen reveals a pulsatile mass that is nontender, no  hepatosplenomegaly.   DIAGNOSTIC STUDIES:  The patient had a CT angiogram today which reveals  a 4.2/4.3 cm infrarenal abdominal aortic aneurysm.  This is similar to  his previous study.  Again, he has 3 right renal arteries, the lower two  arising from his aneurysm.   ASSESSMENT/PLAN:  Asymptomatic infrarenal abdominal aortic aneurysm  which has not grown in a 74-month interval.  I will have the patient  follow up in 6 months with an ultrasound to determine the extent of his  aneurysm.  Given that he has 3 right renal arteries, when we get to the  point of him requiring repair, he will need to undergo formal  arteriogram to evaluate the contribution of his renal arteries to his  kidney function.  Again, I will see him back in 6 months.   [name], MD  Electronically Signed   VWB/MEDQ  D:  06/19/2008  T:  06/20/2008  Job:  [id number]   cc:   Doreen Beam, MD

## 2011-04-15 NOTE — Assessment & Plan Note (Signed)
OFFICE VISIT   COLTRANE, TUGWELL  DOB:  Nov 04, 1936                                       11/08/2007  ZOXWR#:[id number]   REASON FOR EVALUATION:  Abdominal aortic aneurysm.   HISTORY:  This is a 75 year old gentleman I am seeing at the request of  Dr. Sherril Croon for evaluation of abdominal aortic aneurysm.  The patient was  initially discovered to have an aneurysm approximately three years ago  at the time of an MRI for back pain.  Subsequent to that, he has been  having this followed up by ultrasound.  At his last ultrasound, his  aneurysm was noted to be greater than 4 cm in maximal transverse  diameter.  For that reason, he is sent to me for further management.  Patient has been asymptomatic from this perspective.  Specifically, he  denies having any abdominal pain.   Patient's medical problems include hypertension and  hypercholesterolemia, both of which he is being managed for with  medicines and have not been problematic.   REVIEW OF SYSTEMS:  GENERAL:  Negative for weight gain or weight loss.  CARDIAC:  Positive for shortness of breath as well as arrhythmia.  PULMONARY:  Negative.  GI:  Negative.  GU:  Frequent urination.  VASCULAR:  Negative.  NEURO:  Positive for headaches.  ORTHO:  Negative.  PSYCH:  Negative.  ENT:  With recent change in eyesight (diagnosed with glaucoma in  December, 2008).  HEME:  Negative.   PAST MEDICAL HISTORY:  1. Hypertension.  2. Hypercholesterolemia.  3. Cardiac arrhythmia.   PAST SURGICAL HISTORY:  Appendectomy.   FAMILY HISTORY:  Positive for his father having an MI at age 57.   SOCIAL HISTORY:  He smokes approximately 1-2 cigarettes per day.  He is  trying to quit.  No alcohol.   MEDICATIONS:  1. Lipitor 10 mg per day.  2. Zyrtec-D p.r.n.  3. Fluticasone daily.  4. Metoprolol 100 mg daily.  5. Aspirin 81 mg.  6. Centrum Silver daily.   ALLERGIES:  None.   PHYSICAL EXAMINATION:  Heart rate is 57.   Blood pressure is 163/92.  Generally, he is well-appearing in no acute distress.  HEENT:  Normocephalic and atraumatic.  Pupils are equal and round.  Sclerae are  anicteric.  Neck is supple without mass.  There is no JVD.  There are no  carotid bruits.  Cardiovascular:  Irregular with no murmurs, rubs or  gallops.  Pulmonary:  His lungs are clear to auscultation bilaterally.  Abdomen:  Soft, nontender.  No hepatosplenomegaly.  Neuro:  Cranial  nerves II-XII are grossly intact.  Psych:  He is alert and oriented x3.  Extremities:  Palpable femoral pulses and dorsalis pedis pulses  bilaterally.  There are no ulcerations.  They are warm and well  perfused.  He has normal capillary refill.   DIAGNOSTIC STUDIES:  Patient comes in today with an ultrasound study of  his aneurysm, and this reveals an approximately 4.1 cm abdominal aortic  aneurysm with maximum transverse diameter.   ASSESSMENT:  A 75 year old with asymptomatic abdominal aortic aneurysm.   PLAN:  I discussed with the patient the options for repair, which  include open versus endovascular.  He is also told that should he  develop abdominal pain, he should go to the emergency department.  If  his aneurysm  truly measures approximately 4 cm, there is a less than 1%  risk of rupture.  I told him that we would not consider repair until his  aneurysm reaches 5 to 5.5 cm.  Given that he has only had an ultrasound  evaluation and that his aneurysm measures >4 cm, I feel it is best to  evaluate this with a CT scan.  I will schedule him for a CT angiogram of  the abdomen and pelvis to be done on January 12th, and he will see me in  the office following this procedure.   [name], MD  Electronically Signed   VWB/MEDQ  D:  11/08/2007  T:  11/09/2007  Job:  [id number]   cc:   Doreen Beam

## 2011-04-15 NOTE — Assessment & Plan Note (Signed)
OFFICE VISIT   Omar Morales, Omar Morales  DOB:  18-Feb-1936                                       12/13/2007  ZOXWR#:[id number]   REASON FOR EVALUATION:  Abdominal aortic aneurysm.   HISTORY:  This is a 75 year old gentleman I am seeing back for further  evaluation of his abdominal aortic aneurysm.  This was initially  discovered approximately three years ago at the time of MRI.  He has  been followed by ultrasound, and his recent ultrasounds have suggested  an aneurysm 4 cm in diameter.  For that reason, I had recommended a CT  scan.  Patient comes back in today for a discussion of his CT results.  Again, he is asymptomatic, not having any abdominal pain.   PHYSICAL EXAMINATION:  Blood pressure is 135/60.  Pulse is 53.  Generally, he is well-appearing in no acute distress.  His abdomen is  soft and nontender.   DIAGNOSTIC STUDIES:  The patient had CT angiogram today which reveals an  infrarenal abdominal aortic aneurysm with maximal diameter of  approximately 4.2 cm.  An interesting finding on the patient's CT scan  is that he has three right renal arteries, the lower two arising from  his aneurysm.   ASSESSMENT/PLAN:  Abdominal aortic aneurysm:  At this point, I do not  recommend repair of his aneurysm.  Rather, we will repeat a CT scan in  six months.  I will wait to repair his aneurysm when it gets to the 5 to  5.5 cm range.  I have discussed with the patient to call if he has any  new, severe abdominal pain.  I also discussed with him that his aberrant  renal artery anatomy may exclude him from endovascular repair.  We will  have to further evaluate this in the future.  Prior to proceeding with  the repair, I would like to perform a diagnostic arteriogram with  selective cannulation of his aberrant renal arteries to determine  exactly their contribution to his right renal function.  Again, the  patient will follow up with me in six months.   [name], MD  Electronically Signed   VWB/MEDQ  D:  12/13/2007  T:  12/14/2007  Job:  [id number]   cc:   Doreen Beam, MD

## 2011-04-15 NOTE — Procedures (Signed)
DUPLEX ULTRASOUND OF ABDOMINAL AORTA   INDICATION:  Follow up abdominal aortic aneurysm, per CT.   HISTORY:  Diabetes:  No.  Cardiac:  Arrhythmia.  Hypertension:  Yes.  Smoking:  Quit 6 months ago.  Connective Tissue Disorder:  Family History:  Previous Surgery:   DUPLEX EXAM:         AP (cm)                   TRANSVERSE (cm)  Proximal             2.79 cm                   2.84 cm  Mid                  4.59 cm                   4.83 cm  Distal               2.55 cm                   2.59 cm  Right Iliac          1.29 cm                   1.59 cm  Left Iliac           1.48 cm                   1.69 cm   PREVIOUS:  4.4 cm per CT.   IMPRESSION:  Abdominal aortic aneurysm noted with the largest  measurement of 4.59 cm X 4.83 cm.   ___________________________________________  V. Charlena Cross, MD   MG/MEDQ  D:  12/12/2008  T:  12/12/2008  Job:  [id number]

## 2011-04-15 NOTE — Assessment & Plan Note (Signed)
OFFICE VISIT   Omar Morales, Omar Morales  DOB:  1936/11/15                                       07/29/2010  ZOXWR#:[id number]   REASON FOR VISIT:  Follow-up aneurysm.   HISTORY:  This is a 75 year old gentleman that I have been following for  infrarenal abdominal aortic aneurysm.  He was last seen in February  2011.  He comes back in today for a 74-month follow-up with CT scan.  He  denies having any abdominal pain.  He has had some intermittent sharp  right flank pain.  He has a pacemaker in place secondary to a 6-second  pause which was diagnosed at the time of colonoscopy.  He continues to  be medically managed for hypertension and hypercholesterolemia.   There has been no change in his past medical or surgical history.  There  has been no change in his past social or family history.   PHYSICAL EXAM:  Heart rate 85, blood pressure 133/83, O2 saturations  99%.  General:  Well-appearing, no distress.  HEENT:  Within normal  limits.  Lymph nodes within normal limits.  Lungs:  Clear bilaterally.  Cardiovascular:  Regular rate and rhythm.  Abdomen is soft, nontender.  He does have a pulsatile mass which is easily palpable.  He has palpable  pedal pulses.  Neuro:  He is nonfocal.  Skin:  Without rash.  Musculoskeletal:  Without major deformities.   DIAGNOSTIC STUDIES:  I have independently reviewed his CT scan.  This  reveals an infrarenal abdominal aortic aneurysm with a maximal diameter  of 4.9 cm.  He does have three right renal arteries.   ASSESSMENT/PLAN:  Infrarenal abdominal aortic aneurysm.   PLAN:  I discussed that once his aneurysm gets to be greater than 5 cm  we would proceed with catheterization and selective injection of each  renal artery to determine the significance and perfusion to the right  kidney.  We discussed that his accessory renal arteries may require him  to need open repair.  He will come back to see me in 6 months with  repeat CT  scan, again if it is greater than 5 cm we would proceed with  diagnostic angiographic imaging.     [name], MD  Electronically Signed   VWB/MEDQ  D:  07/29/2010  T:  07/30/2010  Job:  [id number]   cc:   Dr. Andee Lineman  Dr. Sherril Croon

## 2011-04-15 NOTE — Assessment & Plan Note (Signed)
OFFICE VISIT   GENTLE, HOGE  DOB:  1936-04-28                                       01/28/2010  EAVWU#:[id number]   REASON FOR VISIT:  Follow-up aneurysm.   HISTORY:  This is a 75 year old gentleman that I have been following for  asymptomatic infrarenal abdominal aortic aneurysm.  He was last  evaluated with a CAT scan in July 2010.  He had a 4.6 x 4.8 cm  infrarenal aneurysm.  He comes in today for a 17-month follow-up.  He  denies having any abdominal pain.  He most recently was diagnosed with  having a 6-second pause in his cardiac cycle.  This was picked up on at  the timing of a colonoscopy.  He underwent placement of a Medtronic  pacemaker.  Since then, he has been doing well without complaints  otherwise.   PAST MEDICAL HISTORY:  Hypertension, hypercholesterolemia, cardiac  arrhythmia.   SOCIAL HISTORY:  He is married with 2 children.  Does not smoke, quit in  2010.  Does not drink.   REVIEW OF SYSTEMS:  Positive only for shortness of breath with exertion.  All others are negative x10, as documented in the encounter form.   PHYSICAL EXAMINATION:  Heart rate 81, blood pressure 121/80, temperature  is 97.8, respirations 20.  General:  He is well-appearing in no  distress.  HEENT:  Within normal limits.  Lungs are clear bilaterally.  Cardiovascular:  Regular rate and rhythm.  Abdomen:  Soft, nontender.  No pulsatile mass.  Musculoskeletal is without major deformities.  Neuro:  He has no focal weaknesses or paresthesias.  Skin is without  rash.   DIAGNOSTICS:  CT scan has been independently reviewed.  By my  measurements, his aneurysm at its maximum diameter is 4.8 x 4.6 cm.  There is no significant change since his last study.   ASSESSMENT:  Asymptomatic infrarenal abdominal aneurysm.   PLAN:  The patient's aneurysm has not grown since his last study.  I  have elected to repeat his CAT scan in 6 months.  We discussed that once  he  becomes greater than 5 cm, we would proceed with arteriogram to  evaluate his multiple renal arteries on the right to see their  significance regarding contribution to the right renal blood flow.  His  aberrant renal arteries could potentially exclude him from being a stent  graft candidate.     [name], MD  Electronically Signed   VWB/MEDQ  D:  01/28/2010  T:  01/29/2010  Job:  [id number]   cc:   Learta Codding, MD,FACC  Dr. Sherril Croon

## 2011-04-15 NOTE — Assessment & Plan Note (Signed)
OFFICE VISIT   CARNIE, BRUEMMER  DOB:  26-Oct-1936                                       12/11/2008  [id number]#:[id number]   REASON FOR VISIT:  Follow-up aneurysm.   HISTORY:  This is a 75 year old gentleman who I am following for an  infrarenal abdominal aortic aneurysm incidentally discovered  approximately 3-1/2 years ago.  He continues to be asymptomatic.  He  denies any abdominal pain, he is getting ready to start an exercise  program, he comes in today for follow-up of his aneurysm.   PHYSICAL EXAMINATION:  Vital Signs:  His blood pressure is 170/109,  pulse is 77.  General:  He is well-appearing, no distress.  Abdomen:  Soft, nontender.  No hepatosplenomegaly, no tenderness.   DIAGNOSTIC STUDIES:  An ultrasound was performed today.  This reveals a  4.6 x 4.8 infrarenal aneurysm which is slightly larger than previous  exam.   ASSESSMENT/PLAN:  Asymptomatic infrarenal abdominal aortic aneurysm.  The patient's aneurysm has increased in size just slightly since his  last study, which was a CT scan.  We discussed today that he is getting  near the point where we will need to get this repaired.  I am going to  have him come back in 6 months with a CT angiogram to better evaluate  his aneurysm.  Please note that he does have three right renal arteries  and so when we get to the point when he needs to have this repaired he  will need to undergo formal arteriogram to evaluate the contribution of  his renal arteries to his kidney function.  Again, he will follow up in  6 months.   [name], MD  Electronically Signed   VWB/MEDQ  D:  12/11/2008  T:  12/12/2008  Job:  [id number]   cc:   Doreen Beam, MD

## 2011-04-15 NOTE — Assessment & Plan Note (Signed)
OFFICE VISIT   DARNELL, JESCHKE  DOB:  [birth year]                                       06/11/2009  UJWJX#:[id number]   REASON FOR VISIT:  Follow-up aneurysm.   HISTORY:  This is a 75 year old gentleman whom I have been following for  an asymptomatic infrarenal abdominal aortic aneurysm.  His aneurysm  previously had measured 4.6 x 4.8 cm.  The patient continues to be  without symptoms of abdominal pain.  He has had no issues since I last  saw him.   PHYSICAL EXAMINATION:  Blood pressure 153/82, pulses 59, respirations  18.  He is well-appearing in no acute distress.  Abdomen:  Soft,  nontender.   DIAGNOSTIC STUDIES:  CT scan was performed today which reveals a 4.8 cm  maximum diameter of his infrarenal aortic aneurysm.  Again he has three  right renal arteries.   ASSESSMENT:  Infrarenal abdominal aortic aneurysm.   PLAN:  We discussed today the risks and benefits of proceeding with  operation versus continued observation.  I think his risk of rupture is  very small given a 4.8 cm aneurysm.  However, I think this will need to  be closely followed.  In the event that he grows greater than 5 cm, we  recommend repair.  At the time repair, he will need to undergo  diagnostic arteriogram to evaluate the contribution of the accessory  right renal arteries to his overall right renal function.  This may  exclude him from being an endovascular candidate, however, we will know  this until we perform the arteriogram.  I am going to see him back in 6  months.   [name], MD  Electronically Signed   VWB/MEDQ  D:  06/11/2009  T:  06/12/2009  Job:  [id number]   cc:   Doreen Beam, MD

## 2011-05-14 ENCOUNTER — Other Ambulatory Visit: Payer: Self-pay | Admitting: Otolaryngology

## 2011-05-14 DIAGNOSIS — E041 Nontoxic single thyroid nodule: Secondary | ICD-10-CM

## 2011-05-26 ENCOUNTER — Other Ambulatory Visit: Payer: Self-pay | Admitting: *Deleted

## 2011-05-26 MED ORDER — METOPROLOL SUCCINATE ER 25 MG PO TB24: 25.0000 mg | ORAL_TABLET | Freq: Every day | ORAL | Status: AC

## 2011-06-11 ENCOUNTER — Other Ambulatory Visit (HOSPITAL_COMMUNITY)
Admission: RE | Admit: 2011-06-11 | Discharge: 2011-06-11 | Disposition: A | Discharge status: Home / Self Care | Payer: Medicare HMO | Source: Ambulatory Visit | Attending: Interventional Radiology | Admitting: Interventional Radiology

## 2011-06-11 ENCOUNTER — Ambulatory Visit
Admission: RE | Admit: 2011-06-11 | Discharge: 2011-06-11 | Disposition: A | Discharge status: Home / Self Care | Payer: Medicare HMO | Source: Ambulatory Visit | Attending: Otolaryngology | Admitting: Otolaryngology

## 2011-06-11 DIAGNOSIS — E049 Nontoxic goiter, unspecified: Secondary | ICD-10-CM | POA: Insufficient documentation

## 2011-06-11 DIAGNOSIS — E041 Nontoxic single thyroid nodule: Secondary | ICD-10-CM

## 2011-08-20 ENCOUNTER — Encounter: Payer: Self-pay | Admitting: Surgery

## 2011-08-28 ENCOUNTER — Encounter: Payer: Medicare HMO | Admitting: *Deleted

## 2011-09-26 ENCOUNTER — Ambulatory Visit (INDEPENDENT_AMBULATORY_CARE_PROVIDER_SITE_OTHER): Payer: Medicare HMO | Admitting: *Deleted

## 2011-09-26 ENCOUNTER — Encounter: Payer: Self-pay | Admitting: Internal Medicine

## 2011-09-26 DIAGNOSIS — I495 Sick sinus syndrome: Secondary | ICD-10-CM

## 2011-09-26 LAB — PACEMAKER DEVICE OBSERVATION
AL THRESHOLD: 0.5 V
ATRIAL PACING PM: 68
RV LEAD IMPEDENCE PM: 496 Ohm
RV LEAD THRESHOLD: 1 V

## 2011-09-26 NOTE — Progress Notes (Signed)
Pacer check in clinic  

## 2011-10-10 ENCOUNTER — Encounter: Payer: Self-pay | Admitting: Surgery

## 2011-10-13 ENCOUNTER — Ambulatory Visit (INDEPENDENT_AMBULATORY_CARE_PROVIDER_SITE_OTHER): Payer: Medicare HMO | Admitting: Vascular Surgery

## 2011-10-13 ENCOUNTER — Encounter: Payer: Self-pay | Admitting: Surgery

## 2011-10-13 ENCOUNTER — Other Ambulatory Visit (INDEPENDENT_AMBULATORY_CARE_PROVIDER_SITE_OTHER): Payer: Medicare HMO | Admitting: Vascular Surgery

## 2011-10-13 ENCOUNTER — Encounter (INDEPENDENT_AMBULATORY_CARE_PROVIDER_SITE_OTHER): Payer: Medicare HMO | Admitting: Vascular Surgery

## 2011-10-13 ENCOUNTER — Ambulatory Visit (INDEPENDENT_AMBULATORY_CARE_PROVIDER_SITE_OTHER): Payer: Medicare HMO | Admitting: Surgery

## 2011-10-13 VITALS — BP 100/68 | HR 82 | Resp 16 | Ht 70.5 in | Wt 169.0 lb

## 2011-10-13 DIAGNOSIS — I714 Abdominal aortic aneurysm, without rupture: Secondary | ICD-10-CM

## 2011-10-13 DIAGNOSIS — I719 Aortic aneurysm of unspecified site, without rupture: Secondary | ICD-10-CM

## 2011-10-13 DIAGNOSIS — Z01810 Encounter for preprocedural cardiovascular examination: Secondary | ICD-10-CM

## 2011-10-13 DIAGNOSIS — I6529 Occlusion and stenosis of unspecified carotid artery: Secondary | ICD-10-CM

## 2011-10-13 DIAGNOSIS — I70219 Atherosclerosis of native arteries of extremities with intermittent claudication, unspecified extremity: Secondary | ICD-10-CM

## 2011-10-13 NOTE — Procedures (Unsigned)
LOWER EXTREMITY ARTERIAL DUPLEX  INDICATION:  Popliteal artery aneurysm.  HISTORY: Diabetes:  No Cardiac:  Pacemaker Hypertension:  Yes Smoking:  Previous Previous Surgery:  No previous intervention  SINGLE LEVEL ARTERIAL EXAM                         RIGHT                LEFT Brachial:               100                  102 Anterior tibial: Posterior tibial: Peroneal: Ankle/Brachial Index:  LOWER EXTREMITY ARTERIAL DUPLEX EXAM  DUPLEX:  Normal vessel diameters involving the bilateral common femoral, superficial femoral, popliteal arteries. No evidence of significant stenosis identified bilaterally.  IMPRESSION:  Normal vessel diameters as noted above and on worksheet.  ___________________________________________ V. Charlena Cross, MD  SH/MEDQ  D:  10/13/2011  T:  10/13/2011  Job:  [id number]

## 2011-10-13 NOTE — Progress Notes (Signed)
[facility] [place]   Patient name: Omar Morales MRN: [id number] DOB: 04-29-1936 Sex: male     Chief Complaint  Patient presents with  . Carotid    6 month f/u  with vascular labs today. Pt has no complaints  . PVD    HISTORY OF PRESENT ILLNESS: Patient comes back today for followup of his abdominal aortic aneurysm. He has had no changes since I last saw him in May of this past year. He denies abdominal pain. He does have a pacemaker in place secondary to 6 second pause. He continues to be treated for his hypertension hypercholesterolemia.  Past Medical History  Diagnosis Date  . Hypertension   . Hypercholesterolemia   . Arrhythmia     cardiac arrythmia  . AAA (abdominal aortic aneurysm)   . Sleep disorder   . Sinus node dysfunction     Past Surgical History  Procedure Date  . Appendectomy   . Tonsillectomy   . Tibia fracture surgery     repair  . Insert / replace / remove pacemaker      1/11 Dr.KLein ppm medtronic    History   Social History  . Marital Status: Married    Spouse Name: N/A    Number of Children: N/A  . Years of Education: N/A   Occupational History  . Not on file.   Social History Main Topics  . Smoking status: Former Smoker    Quit date: 08/01/2009  . Smokeless tobacco: Not on file  . Alcohol Use: No  . Drug Use: No  . Sexually Active: Not on file   Other Topics Concern  . Not on file   Social History Narrative  . No narrative on file    Family History  Problem Relation Age of Onset  . Heart disease Father     Allergies as of 10/13/2011  . (No Known Allergies)    Current Outpatient Prescriptions on File Prior to Visit  Medication Sig Dispense Refill  . aspirin 81 MG EC tablet Take 81 mg by mouth daily.        . hydrochlorothiazide (HYDRODIURIL) 12.5 MG tablet Take 12.5 mg by mouth daily.        Marland Kitchen lisinopril (PRINIVIL,ZESTRIL) 20 MG tablet Take 20 mg by mouth daily.        Marland Kitchen lovastatin (MEVACOR)  40 MG tablet Take 40 mg by mouth at bedtime.        . metoprolol succinate (TOPROL-XL) 25 MG 24 hr tablet Take 1 tablet (25 mg total) by mouth daily.  60 tablet  6  . Multiple Vitamins-Minerals (MULTIVITAMIN WITH MINERALS) tablet Take 1 tablet by mouth daily.        . Tamsulosin HCl (FLOMAX) 0.4 MG CAPS Take 0.4 mg by mouth.        Marland Kitchen azelastine (ASTELIN) 137 MCG/SPRAY nasal spray 2 sprays by Nasal route daily. Use in each nostril as directed         REVIEW OF SYSTEMS: No change from prior visit  PHYSICAL EXAMINATION:   Vital signs are BP 100/68  Pulse 82  Resp 16  Ht 5' 10.5" (1.791 m)  Wt 169 lb (76.658 kg)  BMI 23.91 kg/m2  SpO2 98% General: The patient appears their stated age. Pulmonary:  Non labored breathing Abdomen: Soft and non-tender Musculoskeletal: There are no major deformities.  There is no significant extremity pain. Neurologic: No focal weakness or paresthesias are detected, Skin: There are no ulcer or rashes  noted. Psychiatric: The patient has normal affect. Cardiovascular: There is a regular rate and rhythm without significant murmur appreciated. No carotid bruits palpable dorsalis pedis bilaterally   Diagnostic Studies Carotid duplex: 1-39%  Ordered and reviewed lower extremity duplex which shows no popliteal aneurysm.  Abdominal ultrasound was performed today which shows a stable aneurysm maximum diameter is 5.2  Assessment: Abdominal aortic aneurysm Plan: I again reiterated that I recommend waiting until his aneurysm gets to be greater than 5.5 cm before we consider repair. He has multiple renal arteries that will require reimplantation and [place] and open repair. We discussed the signs and symptoms of rupture and what to do should these happen. He will followup with me in 6 months with a repeat ultrasound he will need a formal cardiac clearance prior to proceeding with open aneurysm repair  V. Charlena Cross, M.D. [facility]  [place] Office: [phone] Pager:  [phone]

## 2011-10-28 NOTE — Procedures (Unsigned)
DUPLEX ULTRASOUND OF ABDOMINAL AORTA  INDICATION:  Followup AAA.  HISTORY: Diabetes:  No Cardiac:  Pacemaker Hypertension:  Yes Smoking:  Previous Connective Tissue Disorder: Family History:  Unknown Previous Surgery:  No aortic intervention  DUPLEX EXAM:         AP (cm)                   TRANSVERSE (cm) Proximal             3.07 cm                   3.14 cm Mid                  2.77/5.02 cm              2.82/5.22 cm Distal               2.47 cm                   2.50 cm Right Iliac          1.08 cm                   1.06 cm Left Iliac           1.26 cm                   1.16 cm  PREVIOUS:  Date:  12/11/2008  AP:  4.59  TRANSVERSE:  4.83  IMPRESSION: 1. Infrarenal abdominal aortic aneurysm measuring 5.02 cm x 5.22 cm     with intramural thrombus present. 2. Aneurysmal dilatation of the proximal aorta measuring 3.07 x 3.14     cm. 3. Patent bilateral common iliac arteries with normal vessel diameters     present. 4. Slight increase in aneurysmal dilatation since previous study on     12/11/2008.  ___________________________________________ V. Charlena Cross, MD  SH/MEDQ  D:  10/13/2011  T:  10/13/2011  Job:  [id number]

## 2011-10-28 NOTE — Procedures (Unsigned)
CAROTID DUPLEX EXAM  INDICATION:  Carotid stenosis.  HISTORY: Diabetes:  No Cardiac:  Pacemaker Hypertension:  Yes Smoking:  Previous Previous Surgery:  No carotid intervention CV History:  Asymptomatic Amaurosis Fugax No, Paresthesias No, Hemiparesis No                                      RIGHT             LEFT Brachial systolic pressure:         100               102 Brachial Doppler waveforms:         WNL               WNL Vertebral direction of flow:        Antegrade         Antegrade DUPLEX VELOCITIES (cm/sec) CCA peak systolic                   84                92 ECA peak systolic                   102               88 ICA peak systolic                   50                37 ICA end diastolic                   19                17 PLAQUE MORPHOLOGY:                  Calcified         Heterogeneous PLAQUE AMOUNT:                      Mild              Mild PLAQUE LOCATION:                    CCA/ICA           CCA/ICA  IMPRESSION: 1. Bilateral internal carotid artery stenosis present in the 1%-39%     range. 2. Bilateral external carotid arteries are patent. 3. Bilateral vertebral arteries are patent and antegrade.  ___________________________________________ V. Charlena Cross, MD  SH/MEDQ  D:  10/13/2011  T:  10/13/2011  Job:  [id number]

## 2011-12-25 ENCOUNTER — Encounter: Payer: Medicare HMO | Admitting: *Deleted

## 2011-12-29 ENCOUNTER — Encounter: Payer: Self-pay | Admitting: *Deleted

## 2011-12-31 ENCOUNTER — Encounter: Payer: Self-pay | Admitting: Internal Medicine

## 2011-12-31 ENCOUNTER — Ambulatory Visit (INDEPENDENT_AMBULATORY_CARE_PROVIDER_SITE_OTHER): Payer: Medicare HMO | Admitting: *Deleted

## 2011-12-31 DIAGNOSIS — I495 Sick sinus syndrome: Secondary | ICD-10-CM

## 2012-01-03 LAB — REMOTE PACEMAKER DEVICE
AL IMPEDENCE PM: 633 Ohm
ATRIAL PACING PM: 67
BATTERY VOLTAGE: 2.78 V

## 2012-01-07 ENCOUNTER — Encounter: Payer: Self-pay | Admitting: *Deleted

## 2012-01-07 NOTE — Progress Notes (Signed)
Remote pacer check  

## 2012-03-10 ENCOUNTER — Ambulatory Visit (INDEPENDENT_AMBULATORY_CARE_PROVIDER_SITE_OTHER): Payer: Medicare HMO | Admitting: Internal Medicine

## 2012-03-10 ENCOUNTER — Encounter: Payer: Self-pay | Admitting: Internal Medicine

## 2012-03-10 VITALS — BP 112/75 | HR 69 | Ht 70.0 in | Wt 193.0 lb

## 2012-03-10 DIAGNOSIS — I4891 Unspecified atrial fibrillation: Secondary | ICD-10-CM

## 2012-03-10 DIAGNOSIS — I495 Sick sinus syndrome: Secondary | ICD-10-CM

## 2012-03-10 DIAGNOSIS — I480 Paroxysmal atrial fibrillation: Secondary | ICD-10-CM | POA: Insufficient documentation

## 2012-03-10 DIAGNOSIS — I714 Abdominal aortic aneurysm, without rupture, unspecified: Secondary | ICD-10-CM

## 2012-03-10 DIAGNOSIS — I10 Essential (primary) hypertension: Secondary | ICD-10-CM

## 2012-03-10 LAB — PACEMAKER DEVICE OBSERVATION
AL THRESHOLD: 0.5 V
BAMS-0001: 175 {beats}/min
RV LEAD AMPLITUDE: 22.4 mv

## 2012-03-10 NOTE — Progress Notes (Signed)
PCP:  Ignatius Specking., MD, MD Primary Cardiologist:  [facility]  The patient presents today for routine electrophysiology followup.  Since last being seen in our clinic, the patient reports doing very well.  He is not aware of afib.  Today, he denies symptoms of palpitations, chest pain, shortness of breath, orthopnea, PND, lower extremity edema, dizziness, presyncope, syncope, or neurologic sequela.  The patient feels that he is tolerating medications without difficulties and is otherwise without complaint today.   Past Medical History  Diagnosis Date  . Hypertension   . Hypercholesterolemia   . Arrhythmia     cardiac arrythmia  . AAA (abdominal aortic aneurysm)     followed by Dr [name]  . Sleep disorder   . Sinus node dysfunction    Past Surgical History  Procedure Date  . Appendectomy   . Tonsillectomy   . Tibia fracture surgery     repair  . Pacemaker insertion 12/10/09    MDT Adaptal L implanted by Dr [name]    Current Outpatient Prescriptions  Medication Sig Dispense Refill  . aspirin 81 MG EC tablet Take 81 mg by mouth daily.        . fexofenadine (ALLEGRA) 180 MG tablet Take 180 mg by mouth daily.      . hydrochlorothiazide (HYDRODIURIL) 12.5 MG tablet Take 12.5 mg by mouth daily.        Marland Kitchen lisinopril (PRINIVIL,ZESTRIL) 20 MG tablet Take 20 mg by mouth daily.        Marland Kitchen lovastatin (MEVACOR) 40 MG tablet Take 40 mg by mouth at bedtime.        . metoprolol succinate (TOPROL-XL) 25 MG 24 hr tablet Take 1 tablet (25 mg total) by mouth daily.  60 tablet  6  . Multiple Vitamins-Minerals (MULTIVITAMIN WITH MINERALS) tablet Take 1 tablet by mouth daily.        . Tamsulosin HCl (FLOMAX) 0.4 MG CAPS Take 0.4 mg by mouth.          No Known Allergies  History   Social History  . Marital Status: Married    Spouse Name: N/A    Number of Children: N/A  . Years of Education: N/A   Occupational History  . Not on file.   Social History Main Topics  . Smoking status: Former Smoker --  1.0 packs/day for 50 years    Types: Cigarettes    Quit date: 08/01/2009  . Smokeless tobacco: Never Used  . Alcohol Use: No  . Drug Use: No  . Sexually Active: Not on file   Other Topics Concern  . Not on file   Social History Narrative  . No narrative on file    Family History  Problem Relation Age of Onset  . Heart disease Father    Physical Exam: Filed Vitals:   03/10/12 1052  BP: 112/75  Pulse: 69  Height: 5\' 10"  (1.778 m)  Weight: 193 lb (87.544 kg)    GEN- The patient is well appearing, alert and oriented x 3 today.   Head- normocephalic, atraumatic Eyes-  Sclera clear, conjunctiva pink Ears- hearing intact Oropharynx- clear Neck- supple, no JVP Lymph- no cervical lymphadenopathy Lungs- Clear to ausculation bilaterally, normal work of breathing Chest- pacemaker pocket is well healed Heart- Regular rate and rhythm, no murmurs, rubs or gallops, PMI not laterally displaced GI- soft, NT, ND, + BS Extremities- no clubbing, cyanosis, or edema   Pacemaker interrogation- reviewed in detail today,  See PACEART report  Assessment and Plan:

## 2012-03-10 NOTE — Assessment & Plan Note (Signed)
Pacemaker interrogation reveals episodes of afib.  These are infrequent and he appears to be asymptomatic.  The longest episode was 3hours, 14 minutes.  We will continue to monitor.  I would anticipate that as his afib burden increases he may require anticoagulation.

## 2012-03-10 NOTE — Assessment & Plan Note (Signed)
Normal pacemaker function See Pace Art report No changes today  

## 2012-03-10 NOTE — Assessment & Plan Note (Signed)
Stable No change required today  

## 2012-03-10 NOTE — Assessment & Plan Note (Signed)
Followed by Dr Brabham 

## 2012-04-12 ENCOUNTER — Ambulatory Visit: Payer: Medicare HMO | Admitting: Surgery

## 2012-04-12 ENCOUNTER — Other Ambulatory Visit: Payer: Medicare HMO

## 2012-05-21 ENCOUNTER — Encounter: Payer: Self-pay | Admitting: Neurosurgery

## 2012-05-24 ENCOUNTER — Other Ambulatory Visit: Payer: Medicare HMO

## 2012-05-24 ENCOUNTER — Ambulatory Visit (INDEPENDENT_AMBULATORY_CARE_PROVIDER_SITE_OTHER): Payer: Medicare HMO | Admitting: Neurosurgery

## 2012-05-24 ENCOUNTER — Encounter: Payer: Self-pay | Admitting: Neurosurgery

## 2012-05-24 ENCOUNTER — Ambulatory Visit (INDEPENDENT_AMBULATORY_CARE_PROVIDER_SITE_OTHER): Payer: Medicare HMO | Admitting: Vascular Surgery

## 2012-05-24 VITALS — BP 118/80 | HR 86 | Resp 14 | Ht 70.5 in | Wt 190.2 lb

## 2012-05-24 DIAGNOSIS — Z01810 Encounter for preprocedural cardiovascular examination: Secondary | ICD-10-CM

## 2012-05-24 DIAGNOSIS — I714 Abdominal aortic aneurysm, without rupture: Secondary | ICD-10-CM

## 2012-05-24 DIAGNOSIS — I70219 Atherosclerosis of native arteries of extremities with intermittent claudication, unspecified extremity: Secondary | ICD-10-CM

## 2012-05-24 NOTE — Progress Notes (Addendum)
[facility] [place] AAA/PAD/PVD Office Note  CC: Six-month AAA duplex Referring Physician: Brabham  History of Present Illness: 76 year old male patient of Dr. Estanislado Spire with known AAA. The patient follows up today for surveillance duplex which does show the AAA has increased to an operable size. Dr. [name] has discussed options with this patient in the past and because of anatomy the open option may be the best for this patient to prevent loss of renal function on one side. The patient denies any abdominal or back pain or any new medical diagnoses.  Past Medical History  Diagnosis Date  . Hypertension   . Hypercholesterolemia   . Arrhythmia     cardiac arrythmia  . AAA (abdominal aortic aneurysm)     followed by Dr [name]  . Sleep disorder   . Sinus node dysfunction     ROS: [x]  Positive   [ ]  Denies    General: [ ]  Weight loss, [ ]  Fever, [ ]  chills Neurologic: [ ]  Dizziness, [ ]  Blackouts, [ ]  Seizure [ ]  Stroke, [ ]  "Mini stroke", [ ]  Slurred speech, [ ]  Temporary blindness; [ ]  weakness in arms or legs, [ ]  Hoarseness Cardiac: [ ]  Chest pain/pressure, [ ]  Shortness of breath at rest [ ]  Shortness of breath with exertion, [ ]  Atrial fibrillation or irregular heartbeat Vascular: [ ]  Pain in legs with walking, [ ]  Pain in legs at rest, [ ]  Pain in legs at night,  [ ]  Non-healing ulcer, [ ]  Blood clot in vein/DVT,   Pulmonary: [ ]  Home oxygen, [x ] Productive cough, [ ]  Coughing up blood, [ ]  Asthma,  [ ]  Wheezing Musculoskeletal:  [ ]  Arthritis, [ ]  Low back pain, [ ]  Joint pain Hematologic: [ ]  Easy Bruising, [ ]  Anemia; [ ]  Hepatitis Gastrointestinal: [ ]  Blood in stool, [ ]  Gastroesophageal Reflux/heartburn, [ ]  Trouble swallowing Urinary: [ ]  chronic Kidney disease, [ ]  on HD - [ ]  MWF or [ ]  TTHS, [ ]  Burning with urination, [ ]  Difficulty urinating Skin: [ ]  Rashes, [ ]  Wounds Psychological: [ ]  Anxiety, [ ]  Depression   Social History History    Substance Use Topics  . Smoking status: Former Smoker -- 1.0 packs/day for 50 years    Types: Cigarettes    Quit date: 08/01/2009  . Smokeless tobacco: Never Used  . Alcohol Use: No    Family History Family History  Problem Relation Age of Onset  . Heart disease Father     No Known Allergies  Current Outpatient Prescriptions  Medication Sig Dispense Refill  . aspirin 81 MG EC tablet Take 81 mg by mouth daily.        . fexofenadine (ALLEGRA) 180 MG tablet Take 180 mg by mouth daily.      . hydrochlorothiazide (HYDRODIURIL) 12.5 MG tablet Take 25 mg by mouth daily.       Marland Kitchen lisinopril (PRINIVIL,ZESTRIL) 20 MG tablet Take 20 mg by mouth daily.        Marland Kitchen lovastatin (MEVACOR) 40 MG tablet Take 40 mg by mouth at bedtime.        . metoprolol succinate (TOPROL-XL) 25 MG 24 hr tablet Take 1 tablet (25 mg total) by mouth daily.  60 tablet  6  . Multiple Vitamins-Minerals (MULTIVITAMIN WITH MINERALS) tablet Take 1 tablet by mouth daily.        . Tamsulosin HCl (FLOMAX) 0.4 MG CAPS Take 0.4 mg by mouth.  Physical Examination  Filed Vitals:   05/24/12 1013  BP: 118/80  Pulse: 86  Resp: 14    Body mass index is 26.91 kg/(m^2).  General:  WDWN in NAD Gait: Normal HEENT: WNL Eyes: Pupils equal Pulmonary: normal non-labored breathing , without Rales, rhonchi,  wheezing Cardiac: RRR, without  Murmurs, rubs or gallops; No carotid bruits Abdomen: soft, NT, no masses Skin: no rashes, ulcers noted Vascular Exam/Pulses: 3+ radial pulses bilaterally, carotid pulses to auscultation no bruits are heard, femoral pulses are intact bilaterally there is also a palpable pulsating abdominal mass.  Extremities without ischemic changes, no Gangrene , no cellulitis; no open wounds;  Musculoskeletal: no muscle wasting or atrophy  Neurologic: A&O X 3; Appropriate Affect ; SENSATION: normal; MOTOR FUNCTION:  moving all extremities equally. Speech is fluent/normal  Non-Invasive Vascular  Imaging: AAA duplex today shows a mid measurement of 5.3 AP by 5.53 transverse  ASSESSMENT/PLAN: Dr. [name] spoke with the patient and his wife at length about surgical options, the current plan will be for the patient to have an abdominal aortogram with renal arteriogram July 10 with Dr. [name], the patient also needs cardiac clearance from Dr. Sherril Croon and or [facility]. The patient also will have carotid duplex and popliteal duplex in our office today as part of the preop workup. The patient and his wife state understanding probably above, their questions were encouraged and answered.  Lauree Chandler ANP  Clinic M.D.: [name]

## 2012-05-31 NOTE — Procedures (Unsigned)
DUPLEX ULTRASOUND OF ABDOMINAL AORTA  INDICATION:  Abdominal aortic aneurysm.  HISTORY: Diabetes:  No. Cardiac:  Pacemaker. Hypertension:  Yes. Smoking:  Previous. Connective Tissue Disorder: Family History: Previous Surgery:  No aortic intervention.  DUPLEX EXAM:         AP (cm)                   TRANSVERSE (cm) Proximal             2.98 cm                   2.85 cm Mid                  5.43 cm                   5.53 cm Distal               2.68 cm                   2.88 cm Right Iliac          1.67 cm                   1.77 cm Left Iliac           2.42 cm                   2.59 cm  PREVIOUS:  Date: 10/13/2011  AP:  5.02  TRANSVERSE:  5.22  IMPRESSION: 1. Infrarenal abdominal aortic aneurysm with intramural thrombus     present measuring 5.43 cm x 5.53 cm. 2. Left common iliac artery aneurysmal dilatation measuring 2.42 cm x     2.59 cm. 3. Increase in aneurysmal diameters  since previous on 10/13/2011.  ___________________________________________ V. Charlena Cross, MD  SH/MEDQ  D:  05/25/2012  T:  05/25/2012  Job:  [id number]

## 2012-06-02 ENCOUNTER — Other Ambulatory Visit: Payer: Self-pay

## 2012-06-02 ENCOUNTER — Encounter (HOSPITAL_COMMUNITY): Payer: Self-pay | Admitting: Pharmacy Technician

## 2012-06-04 ENCOUNTER — Encounter: Payer: Self-pay | Admitting: Neurosurgery

## 2012-06-07 ENCOUNTER — Other Ambulatory Visit (INDEPENDENT_AMBULATORY_CARE_PROVIDER_SITE_OTHER): Payer: Medicare HMO | Admitting: *Deleted

## 2012-06-07 ENCOUNTER — Ambulatory Visit (INDEPENDENT_AMBULATORY_CARE_PROVIDER_SITE_OTHER): Payer: Medicare HMO | Admitting: Neurosurgery

## 2012-06-07 ENCOUNTER — Encounter (INDEPENDENT_AMBULATORY_CARE_PROVIDER_SITE_OTHER): Payer: Medicare HMO | Admitting: *Deleted

## 2012-06-07 ENCOUNTER — Encounter: Payer: Self-pay | Admitting: Neurosurgery

## 2012-06-07 VITALS — BP 135/75 | HR 61 | Resp 14 | Ht 70.0 in | Wt 190.7 lb

## 2012-06-07 DIAGNOSIS — Z01810 Encounter for preprocedural cardiovascular examination: Secondary | ICD-10-CM

## 2012-06-07 DIAGNOSIS — I6529 Occlusion and stenosis of unspecified carotid artery: Secondary | ICD-10-CM

## 2012-06-07 DIAGNOSIS — I714 Abdominal aortic aneurysm, without rupture: Secondary | ICD-10-CM

## 2012-06-07 NOTE — Progress Notes (Signed)
Subjective:     Patient ID: Omar Morales, male   DOB: 02/18/1936, 76 y.o.   MRN: [id number]  HPI: 76-year-old male patient of Dr. Brabham who is pending open AAA repair is here today for popliteal artery duplex as well as carotid duplex. The patient has no new complaints, he is pending his aortogram with Dr. Brabham Wednesday, July 10. The patient states she is also attending his cardiac clearance from July 15.   Review of Systems: 12 point review of systems is notable for the difficulties described above otherwise unremarkable     Objective:   Physical Exam: The patient is afebrile, vital signs are stable, no changes in his medical status. The patient is pending his aortogram with Dr. Brabham and his cardiac clearance for the next week.     Assessment:     Pending open AAA repair after all preoperative studies are complete, no new complaints. Vascular lab today shows a maximum diameters of bilateral popliteal arteries are less than 0.9 cm, carotid duplex shows minimal bilateral ICA stenosis.    Plan:     For vascular lab with Dr. Brabham July 10, cardiac clearance with lower cardiology for July 15. Followup-year-old be pending his AAA repair.   Kaeleen Odom ANP  Clinic M.D. Brabham      

## 2012-06-09 ENCOUNTER — Ambulatory Visit (HOSPITAL_COMMUNITY)
Admission: RE | Admit: 2012-06-09 | Discharge: 2012-06-09 | Disposition: A | Discharge status: Home / Self Care | Payer: Medicare HMO | Source: Ambulatory Visit | Attending: Surgery | Admitting: Surgery

## 2012-06-09 ENCOUNTER — Encounter (HOSPITAL_COMMUNITY)
Admission: RE | Disposition: A | Discharge status: Home / Self Care | Payer: Self-pay | Source: Ambulatory Visit | Attending: Surgery

## 2012-06-09 DIAGNOSIS — I714 Abdominal aortic aneurysm, without rupture, unspecified: Secondary | ICD-10-CM | POA: Insufficient documentation

## 2012-06-09 DIAGNOSIS — Q278 Other specified congenital malformations of peripheral vascular system: Secondary | ICD-10-CM | POA: Insufficient documentation

## 2012-06-09 HISTORY — PX: OTHER SURGICAL HISTORY: SHX169

## 2012-06-09 HISTORY — PX: ABDOMINAL AORTAGRAM: SHX5454

## 2012-06-09 LAB — POCT I-STAT, CHEM 8
BUN: 33 mg/dL — ABNORMAL HIGH (ref 6–23)
Calcium, Ion: 1.16 mmol/L (ref 1.13–1.30)
Chloride: 102 mEq/L (ref 96–112)
Glucose, Bld: 94 mg/dL (ref 70–99)
TCO2: 27 mmol/L (ref 0–100)

## 2012-06-09 SURGERY — ABDOMINAL AORTAGRAM
Anesthesia: LOCAL

## 2012-06-09 MED ORDER — LIDOCAINE HCL (PF) 1 % IJ SOLN
INTRAMUSCULAR | Status: AC
  Filled 2012-06-09: qty 30

## 2012-06-09 MED ORDER — SODIUM CHLORIDE 0.9 % IV SOLN
INTRAVENOUS | Status: DC
  Administered 2012-06-09: 07:00:00 via INTRAVENOUS
  Administered 2012-06-09: 700 mL via INTRAVENOUS

## 2012-06-09 MED ORDER — METOPROLOL TARTRATE 1 MG/ML IV SOLN: 2.0000 mg | INTRAVENOUS | Status: DC | PRN

## 2012-06-09 MED ORDER — MIDAZOLAM HCL 2 MG/2ML IJ SOLN
INTRAMUSCULAR | Status: AC
  Filled 2012-06-09: qty 2

## 2012-06-09 MED ORDER — ONDANSETRON HCL 4 MG/2ML IJ SOLN: 4.0000 mg | Freq: Four times a day (QID) | INTRAMUSCULAR | Status: DC | PRN

## 2012-06-09 MED ORDER — HEPARIN (PORCINE) IN NACL 2-0.9 UNIT/ML-% IJ SOLN
INTRAMUSCULAR | Status: AC
  Filled 2012-06-09: qty 1000

## 2012-06-09 MED ORDER — LABETALOL HCL 5 MG/ML IV SOLN: 10.0000 mg | INTRAVENOUS | Status: DC | PRN

## 2012-06-09 MED ORDER — OXYCODONE HCL 5 MG PO TABS: 5.0000 mg | ORAL_TABLET | ORAL | Status: DC | PRN

## 2012-06-09 MED ORDER — FENTANYL CITRATE 0.05 MG/ML IJ SOLN
INTRAMUSCULAR | Status: AC
  Filled 2012-06-09: qty 2

## 2012-06-09 MED ORDER — SODIUM CHLORIDE 0.9 % IV SOLN: 1.0000 mL/kg/h | INTRAVENOUS | Status: DC

## 2012-06-09 MED ORDER — GUAIFENESIN-DM 100-10 MG/5ML PO SYRP: 15.0000 mL | ORAL_SOLUTION | ORAL | Status: DC | PRN

## 2012-06-09 MED ORDER — ACETAMINOPHEN 325 MG PO TABS: 325.0000 mg | ORAL_TABLET | ORAL | Status: DC | PRN

## 2012-06-09 MED ORDER — PHENOL 1.4 % MT LIQD: 1.0000 | OROMUCOSAL | Status: DC | PRN

## 2012-06-09 MED ORDER — ACETAMINOPHEN 325 MG RE SUPP: 325.0000 mg | RECTAL | Status: DC | PRN

## 2012-06-09 MED ORDER — HYDRALAZINE HCL 20 MG/ML IJ SOLN: 10.0000 mg | INTRAMUSCULAR | Status: DC | PRN

## 2012-06-09 MED ORDER — ALUM & MAG HYDROXIDE-SIMETH 200-200-20 MG/5ML PO SUSP: 15.0000 mL | ORAL | Status: DC | PRN

## 2012-06-09 NOTE — Interval H&P Note (Signed)
History and Physical Interval Note:  06/09/2012 8:41 AM  Omar Morales  has presented today for surgery, with the diagnosis of aaa  The various methods of treatment have been discussed with the patient and family. After consideration of risks, benefits and other options for treatment, the patient has consented to  Procedure(s) (LRB): ABDOMINAL AORTAGRAM (N/A) RENAL ANGIOGRAM (N/A) as a surgical intervention .  The patient's history has been reviewed, patient examined, no change in status, stable for surgery.  I have reviewed the patients' chart and labs.  Questions were answered to the patient's satisfaction.     Joshuwa Vecchio IV, V. WELLS

## 2012-06-09 NOTE — Op Note (Signed)
[facility] [place]  Patient name: Omar Morales MRN: [id number] DOB: 07/03/1936 Sex: male  06/09/2012 Pre-operative Diagnosis: Abdominal aortic aneurysm Post-operative diagnosis:  Same Surgeon:  [name] Procedure Performed:  1.  ultrasound access right femoral artery  2.   Abdominal aortogram  3.  bifemoral runoff   Indications:  The patient is here for further evaluation of his abdominal aortic aneurysm. He was found to have a accessory renal artery on the right near the aortic bifurcation he is here to determine the contribution this artery has to the right kidney.  Procedure:  The patient was identified in the holding area and taken to room 8.  The patient was then placed supine on the table and prepped and draped in the usual sterile fashion.  A time out was called.  Ultrasound was used to evaluate the right common femoral artery.  It was patent .  A digital ultrasound image was acquired. The right common femoral artery was accessed under ultrasound guidance. I had difficulty advancing the Bentson wire into the aorta. A Kumpe catheter was placed to navigate the wire into the aorta. I then placed a 5 French sheath. Over the wire an omni-flush catheter was advanced to the level of L1 and an abdominal aortogram was obtained. Next the cath was pulled down to the aortic bifurcation and pelvic angiogram was performed Findings:   Aortogram:  The visualized portions of the suprarenal abdominal aorta showed no significant disease. The main renal arteries show no evidence of stenosis. The right renal artery is small and caliber. There are to accessory renal arteries to the right kidney which originate near the aortic bifurcation. They appeared to perfuse approximately 2/3 of the right kidney. The iliac vessels are ectatic and tortuous.    Impression:  #1  abdominal aortic aneurysm  #2  accessory right renal artery with approximately perfusion to two thirds of  the right kidney   V. Durene Cal, M.D. [facility] [place] Office: [phone] Pager:  [phone]

## 2012-06-09 NOTE — H&P (View-Only) (Signed)
Subjective:     Patient ID: Omar Morales, male   DOB: 1936/07/26, 76 y.o.   MRN: [id number]  HPI: 76 year old male patient of Dr. [name] who is pending open AAA repair is here today for popliteal artery duplex as well as carotid duplex. The patient has no new complaints, he is pending his aortogram with Dr. [name] Wednesday, July 10. The patient states she is also attending his cardiac clearance from July 15.   Review of Systems: 12 point review of systems is notable for the difficulties described above otherwise unremarkable     Objective:   Physical Exam: The patient is afebrile, vital signs are stable, no changes in his medical status. The patient is pending his aortogram with Dr. [name] and his cardiac clearance for the next week.     Assessment:     Pending open AAA repair after all preoperative studies are complete, no new complaints. Vascular lab today shows a maximum diameters of bilateral popliteal arteries are less than 0.9 cm, carotid duplex shows minimal bilateral ICA stenosis.    Plan:     For vascular lab with Dr. [name] July 10, cardiac clearance with lower cardiology for July 15. Followup-year-old be pending his AAA repair.   Lauree Chandler ANP  Clinic M.D. Brabham

## 2012-06-10 ENCOUNTER — Encounter: Payer: Self-pay | Admitting: Internal Medicine

## 2012-06-10 ENCOUNTER — Ambulatory Visit (INDEPENDENT_AMBULATORY_CARE_PROVIDER_SITE_OTHER): Payer: Medicare HMO | Admitting: *Deleted

## 2012-06-10 DIAGNOSIS — I495 Sick sinus syndrome: Secondary | ICD-10-CM

## 2012-06-14 ENCOUNTER — Ambulatory Visit (INDEPENDENT_AMBULATORY_CARE_PROVIDER_SITE_OTHER): Payer: Medicare HMO | Admitting: Cardiology

## 2012-06-14 ENCOUNTER — Encounter: Payer: Self-pay | Admitting: *Deleted

## 2012-06-14 ENCOUNTER — Encounter: Payer: Self-pay | Admitting: Cardiology

## 2012-06-14 VITALS — BP 112/79 | HR 80 | Ht 70.0 in | Wt 189.4 lb

## 2012-06-14 DIAGNOSIS — I10 Essential (primary) hypertension: Secondary | ICD-10-CM

## 2012-06-14 DIAGNOSIS — I714 Abdominal aortic aneurysm, without rupture, unspecified: Secondary | ICD-10-CM

## 2012-06-14 DIAGNOSIS — I495 Sick sinus syndrome: Secondary | ICD-10-CM

## 2012-06-14 DIAGNOSIS — Z01810 Encounter for preprocedural cardiovascular examination: Secondary | ICD-10-CM

## 2012-06-14 DIAGNOSIS — E785 Hyperlipidemia, unspecified: Secondary | ICD-10-CM

## 2012-06-14 NOTE — Procedures (Unsigned)
VASCULAR LAB EXAM  INDICATION:  Preop evaluation, abdominal aortic aneurysm.  HISTORY: Diabetes:  No. Cardiac:  A-fib. Hypertension:  Yes.  EXAM:  Bilateral popliteal artery duplex.  IMPRESSION:  Biphasic Doppler waveforms noted throughout the bilateral popliteal arteries with maximum diameter measurements of less than 0.90 cm.  ___________________________________________ V. Charlena Cross, MD  CH/MEDQ  D:  06/10/2012  T:  06/10/2012  Job:  [id number]

## 2012-06-14 NOTE — Procedures (Unsigned)
CAROTID DUPLEX EXAM  INDICATION:  Preoperative evaluation.  HISTORY: Diabetes:  No Cardiac:  Atrial fibrillation Hypertension:  Yes Smoking:  Previous Previous Surgery:  No CV History:  Currently asymptomatic Amaurosis Fugax No, Paresthesias No, Hemiparesis No                                      RIGHT             LEFT Brachial systolic pressure:         144               142 Brachial Doppler waveforms:         Normal            Normal Vertebral direction of flow:        Antegrade         Antegrade DUPLEX VELOCITIES (cm/sec) CCA peak systolic                   58                77 ECA peak systolic                   105               102 ICA peak systolic                   51                40 ICA end diastolic                   14                15 PLAQUE MORPHOLOGY:                  Heterogeneous     Mixed PLAQUE AMOUNT:                      Mild              Mild PLAQUE LOCATION:                    ICA/ECA           ICA/ECA  IMPRESSION:  Doppler velocities suggest 1% to 39% stenosis of the bilateral proximal internal carotid arteries.  ___________________________________________ V. Charlena Cross, MD  CH/MEDQ  D:  06/10/2012  T:  06/10/2012  Job:  [id number]

## 2012-06-14 NOTE — Patient Instructions (Addendum)
Your physician recommends that you schedule a follow-up appointment in: 1 year with Dr. Antoine Poche. You will receive a reminder letter in the mail in about 10 months reminding you to call and schedule your appointment. If you don't receive this letter, please contact our office.   Your physician has requested that you have en exercise stress myoview. For further information please visit [email or web address] Please follow instruction sheet, as given.  Your physician recommends that you continue on your current medications as directed. Please refer to the Current Medication list given to you today.

## 2012-06-14 NOTE — Progress Notes (Signed)
HPI The patient presents for preoperative examination. He has a history of peripheral vascular disease and is to undergo abdominal aortic aneurysm repair. He has no prior history of coronary disease though he does have sinus node dysfunction pacemaker. He does get along relatively well doing activity such as gardening. He says he will get some shortness of breath occasionally with this. He does not describe chest pressure , neck or arm discomfort. He does not describe palpitations, presyncope or syncope. He has no PND or orthopnea. He denies leg pain or swelling.  No Known Allergies  Current Outpatient Prescriptions  Medication Sig Dispense Refill  . aspirin 81 MG EC tablet Take 81 mg by mouth daily.        . fexofenadine (ALLEGRA) 180 MG tablet Take 180 mg by mouth daily.      Marland Kitchen guaiFENesin (MUCINEX) 600 MG 12 hr tablet Take 600 mg by mouth 2 (two) times daily.       . hydrochlorothiazide (HYDRODIURIL) 25 MG tablet Take 12.5 mg by mouth daily.      Marland Kitchen lisinopril (PRINIVIL,ZESTRIL) 20 MG tablet Take 20 mg by mouth daily.        Marland Kitchen lovastatin (MEVACOR) 40 MG tablet Take 40 mg by mouth at bedtime.        . metoprolol succinate (TOPROL-XL) 25 MG 24 hr tablet Take 1 tablet (25 mg total) by mouth daily.  60 tablet  6  . Multiple Vitamins-Minerals (MULTIVITAMIN WITH MINERALS) tablet Take 1 tablet by mouth daily.        . Oxymetazoline HCl (SINUS RELIEF NASAL SPRAY NA) Place into the nose as needed.      . Tamsulosin HCl (FLOMAX) 0.4 MG CAPS Take 0.4 mg by mouth at bedtime.         Past Medical History  Diagnosis Date  . Hypertension   . Hypercholesterolemia   . Arrhythmia     cardiac arrythmia  . AAA (abdominal aortic aneurysm)     followed by Dr [name]  . Sleep disorder   . Sinus node dysfunction     Past Surgical History  Procedure Date  . Appendectomy   . Tonsillectomy   . Tibia fracture surgery     repair  . Pacemaker insertion 12/10/09    MDT Adaptal L implanted by Dr [name]     ROS:  As stated in the HPI and negative for all other systems.  PHYSICAL EXAM BP 112/79  Pulse 80  Ht 5\' 10"  (1.778 m)  Wt 189 lb 6.4 oz (85.911 kg)  BMI 27.18 kg/m2  SpO2 97% GENERAL:  Well appearing HEENT:  Pupils equal round and reactive, fundi not visualized, oral mucosa unremarkable , dentures NECK:  No jugular venous distention, waveform within normal limits, carotid upstroke brisk and symmetric, no bruits, no thyromegaly LYMPHATICS:  No cervical, inguinal adenopathy LUNGS:  Clear to auscultation bilaterally BACK:  No CVA tenderness CHEST:  Well healed pacemaker pocket HEART:  PMI not displaced or sustained,S1 and S2 within normal limits, no S3, no S4, no clicks, no rubs, no murmurs ABD:  Flat, positive bowel sounds normal in frequency in pitch, no bruits, no rebound, no guarding,  Positive midline pulsatile mass, no hepatomegaly, no splenomegaly EXT:  2 plus pulses throughout, no edema, no cyanosis no clubbing SKIN:  No rashes no nodules NEURO:  Cranial nerves II through XII grossly intact, motor grossly intact throughout PSYCH:  Cognitively intact, oriented to person place and time   EKG:  06/10/12  Sinus rhythm , rate 61 , premature ventricular contractions, demand ventricular pacemaker , left anterior fascicular block , poor anterior R wave progression, nonspecific diffuse T wave flattening.  ASSESSMENT AND PLAN  Preoperative examination -  The patient does have a moderate functional level. He has cardiovascular risk factors with some evidence of peripheral vascular disease with his abdominal aortic aneurysm. He's going for a procedure that is somewhat high risk from a cardiovascular standpoint. Therefore, according to ACC/AHA guidelines stress testing is indicated. He will have an exercise perfusion study.  Sinus node dysfunction -   The patient is up to date with  followup with Dr. Johney Frame.  AAA -   As above.

## 2012-06-15 ENCOUNTER — Other Ambulatory Visit: Payer: Self-pay | Admitting: Cardiology

## 2012-06-15 DIAGNOSIS — Z01810 Encounter for preprocedural cardiovascular examination: Secondary | ICD-10-CM

## 2012-06-15 DIAGNOSIS — I495 Sick sinus syndrome: Secondary | ICD-10-CM

## 2012-06-17 ENCOUNTER — Telehealth: Payer: Self-pay

## 2012-06-17 NOTE — Telephone Encounter (Signed)
Exercise stress myoview --Dr. Antoine Poche - Weight 189   Diagnosis/ 427.81, V72.81  . Friday, July 19th, 2013 at [facility]  Checking percert

## 2012-06-17 NOTE — Telephone Encounter (Signed)
Auth # [id number] exp 07/17/12

## 2012-06-18 DIAGNOSIS — R079 Chest pain, unspecified: Secondary | ICD-10-CM

## 2012-06-18 LAB — REMOTE PACEMAKER DEVICE
AL IMPEDENCE PM: 621 Ohm
ATRIAL PACING PM: 78
BATTERY VOLTAGE: 2.79 V
VENTRICULAR PACING PM: 33

## 2012-06-22 ENCOUNTER — Telehealth: Payer: Self-pay | Admitting: Cardiology

## 2012-06-22 NOTE — Telephone Encounter (Signed)
Results of stress test from 06/18/12 reviewed. No definite evidence of ischemia. LVF NL. Pt is cleared to proceed with surgery, as scheduled, with no further cardiac workup recommended. Please ensure that Dr Hochrein's corresponding office note is sent to the surgeon.

## 2012-06-22 NOTE — Telephone Encounter (Signed)
Left message with wife for patient to call office

## 2012-06-22 NOTE — Telephone Encounter (Signed)
Please inform pt that we will notify him of test result, as soon as it is available.

## 2012-06-22 NOTE — Telephone Encounter (Signed)
Result already available in chart.

## 2012-06-22 NOTE — Telephone Encounter (Signed)
Patient informed that results not reviewed by Hochrein yet to give official response. Patient states that he is scheduled for surgery on the 5th of August and will need to know if he is cleared for surgery. Please advise of stress test result and clearance for surgery.

## 2012-06-22 NOTE — Telephone Encounter (Signed)
Patient informed. Copy to Dr. [name] and Sherril Croon.

## 2012-06-22 NOTE — Telephone Encounter (Signed)
Omar Morales called wanting to get test results of his stress test. Please leave message on machine If he does not answer.

## 2012-06-28 ENCOUNTER — Encounter: Payer: Self-pay | Admitting: *Deleted

## 2012-07-02 ENCOUNTER — Encounter: Payer: Self-pay | Admitting: Surgery

## 2012-07-05 ENCOUNTER — Encounter: Payer: Self-pay | Admitting: Surgery

## 2012-07-05 ENCOUNTER — Ambulatory Visit (INDEPENDENT_AMBULATORY_CARE_PROVIDER_SITE_OTHER): Payer: Medicare HMO | Admitting: Surgery

## 2012-07-05 VITALS — BP 103/67 | HR 62 | Temp 98.1°F | Resp 16 | Ht 70.0 in | Wt 191.0 lb

## 2012-07-05 DIAGNOSIS — I714 Abdominal aortic aneurysm, without rupture: Secondary | ICD-10-CM

## 2012-07-05 NOTE — Progress Notes (Signed)
[facility] [place]   Patient name: Omar Morales MRN: [id number] DOB: 01-Aug-1936 Sex: male     Chief Complaint  Patient presents with  . AAA    Pre / Op  visit     HISTORY OF PRESENT ILLNESS: The patient comes in today for further discussions of his abdominal aortic aneurysm. He has had no abdominal or back pain since his angiogram.  Past Medical History  Diagnosis Date  . Hypertension   . Hypercholesterolemia   . Arrhythmia     cardiac arrythmia  . AAA (abdominal aortic aneurysm)     followed by Dr [name]  . Sleep disorder   . Sinus node dysfunction   . Irregular heart beat     Past Surgical History  Procedure Date  . Tonsillectomy   . Tibia fracture surgery     repair  . Pacemaker insertion 12/10/09    MDT Adaptal L implanted by Dr [name]  . Aortagram 06/09/12    Abdominal Aortagram  . Fracture surgery 1942    Broken leg Right   . Appendectomy 1947    History   Social History  . Marital Status: Married    Spouse Name: N/A    Number of Children: N/A  . Years of Education: N/A   Occupational History  . Not on file.   Social History Main Topics  . Smoking status: Former Smoker -- 1.0 packs/day for 50 years    Types: Cigarettes    Quit date: 08/01/2009  . Smokeless tobacco: Never Used  . Alcohol Use: No  . Drug Use: No  . Sexually Active: Not on file   Other Topics Concern  . Not on file   Social History Narrative  . No narrative on file    Family History  Problem Relation Age of Onset  . Heart disease Father   . Heart attack Father   . Hyperlipidemia Mother     Allergies as of 07/05/2012  . (No Known Allergies)    Current Outpatient Prescriptions on File Prior to Visit  Medication Sig Dispense Refill  . aspirin 81 MG EC tablet Take 81 mg by mouth daily.        . chlorpheniramine (CHLOR-TRIMETON) 4 MG tablet Take 4 mg by mouth as needed.      Marland Kitchen guaiFENesin (MUCINEX) 600 MG 12 hr tablet Take 600 mg by mouth 2  (two) times daily.       . hydrochlorothiazide (HYDRODIURIL) 25 MG tablet Take 12.5 mg by mouth daily.      Marland Kitchen lisinopril (PRINIVIL,ZESTRIL) 20 MG tablet Take 20 mg by mouth daily.        Marland Kitchen lovastatin (MEVACOR) 40 MG tablet Take 40 mg by mouth at bedtime.        . metoprolol succinate (TOPROL-XL) 25 MG 24 hr tablet Take 1 tablet (25 mg total) by mouth daily.  60 tablet  6  . Multiple Vitamins-Minerals (MULTIVITAMIN WITH MINERALS) tablet Take 1 tablet by mouth daily.        . Oxymetazoline HCl (SINUS RELIEF NASAL SPRAY NA) Place into the nose as needed.      . Tamsulosin HCl (FLOMAX) 0.4 MG CAPS Take 0.4 mg by mouth at bedtime.       . fexofenadine (ALLEGRA) 180 MG tablet Take 180 mg by mouth daily.         REVIEW OF SYSTEMS: No change from prior visit  PHYSICAL EXAMINATION:   Vital signs are BP 103/67  Pulse 62  Temp 98.1 F (36.7 C) (Oral)  Resp 16  Ht 5\' 10"  (1.778 m)  Wt 191 lb (86.637 kg)  BMI 27.41 kg/m2  SpO2 98% General: The patient appears their stated age. HEENT:  No gross abnormalities Pulmonary:  Non labored breathing Abdomen: Soft and non-tender Musculoskeletal: There are no major deformities. Neurologic: No focal weakness or paresthesias are detected, Skin: There are no ulcer or rashes noted. Psychiatric: The patient has normal affect. Cardiovascular: There is a regular rate and rhythm without significant murmur appreciated. 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 I spent a greater than one hour reviewing the patient's studies and discussing this with he and his wife. We discussed all of the possible complications including but not limited to the risk of death, cardiopulmonary complications,  infection, renal insufficiency, intestinal ischemia, lower extremity ischemia, bleeding, and infection. All his questions were answered. My plan is to use a aortobiiliac graft and reimplant be a Barrett right renal artery. I did discuss this may require a bypass to the right renal artery. The operation has been scheduled for Thursday, September 5. He has already had cardiac clearance  V. Charlena Cross, M.D. [facility] [place] Office: [phone] Pager:  [phone]

## 2012-07-08 ENCOUNTER — Other Ambulatory Visit: Payer: Self-pay

## 2012-07-20 ENCOUNTER — Encounter (HOSPITAL_COMMUNITY): Payer: Self-pay | Admitting: Pharmacy Technician

## 2012-07-29 ENCOUNTER — Encounter (HOSPITAL_COMMUNITY)
Admission: RE | Admit: 2012-07-29 | Discharge: 2012-07-29 | Disposition: A | Discharge status: Home / Self Care | Payer: Medicare HMO | Source: Ambulatory Visit | Attending: Surgery | Admitting: Surgery

## 2012-07-29 ENCOUNTER — Encounter (HOSPITAL_COMMUNITY): Payer: Self-pay

## 2012-07-29 LAB — COMPREHENSIVE METABOLIC PANEL
ALT: 19 U/L (ref 0–53)
Alkaline Phosphatase: 63 U/L (ref 39–117)
BUN: 20 mg/dL (ref 6–23)
CO2: 25 mEq/L (ref 19–32)
Calcium: 10.2 mg/dL (ref 8.4–10.5)
GFR calc Af Amer: 76 mL/min — ABNORMAL LOW (ref >90)
GFR calc non Af Amer: 66 mL/min — ABNORMAL LOW (ref >90)
Glucose, Bld: 86 mg/dL (ref 70–99)
Sodium: 135 mEq/L (ref 135–145)

## 2012-07-29 LAB — CBC
HCT: 44.3 % (ref 39.0–52.0)
Hemoglobin: 15.2 g/dL (ref 13.0–17.0)
MCH: 33.2 pg (ref 26.0–34.0)
RBC: 4.58 MIL/uL (ref 4.22–5.81)

## 2012-07-29 LAB — SURGICAL PCR SCREEN
MRSA, PCR: NEGATIVE
Staphylococcus aureus: NEGATIVE

## 2012-07-29 LAB — URINALYSIS, ROUTINE W REFLEX MICROSCOPIC
Bilirubin Urine: NEGATIVE
Hgb urine dipstick: NEGATIVE
Specific Gravity, Urine: 1.012 (ref 1.005–1.030)
pH: 6.5 (ref 5.0–8.0)

## 2012-07-29 LAB — PROTIME-INR: Prothrombin Time: 14.6 seconds (ref 11.6–15.2)

## 2012-07-29 NOTE — Pre-Procedure Instructions (Signed)
20 EBAN WEICK  07/29/2012   Your procedure is scheduled on:  August 05, 2012 (Thursday)  Report to [facility] at 5:30 AM.  Call this number if you have problems the morning of surgery: [phone]   Remember:   Do not eat food:After Midnight.    Take these medicines the morning of surgery with A SIP OF WATER: lovastatin(mevacor), metoprolol(toprol)   Do not wear jewelry, make-up or nail polish.  Do not wear lotions, powders, or perfumes. You may wear deodorant.  Do not shave 48 hours prior to surgery. Men may shave face and neck.  Do not bring valuables to the hospital.  Contacts, dentures or bridgework may not be worn into surgery.  Leave suitcase in the car. After surgery it may be brought to your room.  For patients admitted to the hospital, checkout time is 11:00 AM the day of discharge.   Patients discharged the day of surgery will not be allowed to drive home.  Name and phone number of your driver:   Special Instructions: CHG Shower Use Special Wash: 1/2 bottle night before surgery and 1/2 bottle morning of surgery.   Please read over the following fact sheets that you were given: Pain Booklet, Coughing and Deep Breathing, Blood Transfusion Information and Surgical Site Infection Prevention

## 2012-08-04 MED ORDER — TRANEXAMIC ACID (OHS) PUMP PRIME SOLUTION
2.0000 mg/kg | INTRAVENOUS | Status: DC
Start: 2012-08-05 — End: 2012-08-05
  Filled 2012-08-04: qty 1.72

## 2012-08-04 MED ORDER — CEFUROXIME SODIUM 1.5 G IJ SOLR
1.5000 g | INTRAMUSCULAR | Status: AC
  Administered 2012-08-05: 1.5 g via INTRAVENOUS
  Filled 2012-08-04: qty 1.5

## 2012-08-04 MED ORDER — NITROGLYCERIN IN D5W 200-5 MCG/ML-% IV SOLN
2.0000 ug/min | INTRAVENOUS | Status: DC
  Filled 2012-08-04: qty 250

## 2012-08-04 MED ORDER — VANCOMYCIN HCL 1000 MG IV SOLR
1500.0000 mg | INTRAVENOUS | Status: DC
  Filled 2012-08-04: qty 1500

## 2012-08-04 MED ORDER — TRANEXAMIC ACID 100 MG/ML IV SOLN
1.5000 mg/kg/h | INTRAVENOUS | Status: DC
  Filled 2012-08-04 (×2): qty 25

## 2012-08-04 MED ORDER — DEXTROSE 5 % IV SOLN
750.0000 mg | INTRAVENOUS | Status: DC
  Filled 2012-08-04: qty 750

## 2012-08-04 MED ORDER — PHENYLEPHRINE HCL 10 MG/ML IJ SOLN
30.0000 ug/min | INTRAVENOUS | Status: DC
  Filled 2012-08-04: qty 2

## 2012-08-04 MED ORDER — DEXTROSE 5 % IV SOLN
1.5000 g | INTRAVENOUS | Status: DC
  Filled 2012-08-04: qty 1.5

## 2012-08-04 MED ORDER — DOPAMINE-DEXTROSE 3.2-5 MG/ML-% IV SOLN
2.0000 ug/kg/min | INTRAVENOUS | Status: DC
  Filled 2012-08-04: qty 250

## 2012-08-04 MED ORDER — POTASSIUM CHLORIDE 2 MEQ/ML IV SOLN
80.0000 meq | INTRAVENOUS | Status: DC
  Filled 2012-08-04: qty 40

## 2012-08-04 MED ORDER — SODIUM BICARBONATE 8.4 % IV SOLN
INTRAVENOUS | Status: DC
  Filled 2012-08-04: qty 2.5

## 2012-08-04 MED ORDER — MAGNESIUM SULFATE 50 % IJ SOLN
40.0000 meq | INTRAMUSCULAR | Status: DC
  Filled 2012-08-04: qty 10

## 2012-08-04 MED ORDER — TRANEXAMIC ACID (OHS) BOLUS VIA INFUSION
15.0000 mg/kg | INTRAVENOUS | Status: DC
  Filled 2012-08-04: qty 1293

## 2012-08-04 MED ORDER — EPINEPHRINE HCL 1 MG/ML IJ SOLN
0.5000 ug/min | INTRAVENOUS | Status: DC
  Filled 2012-08-04: qty 4

## 2012-08-04 MED ORDER — DEXMEDETOMIDINE HCL IN NACL 400 MCG/100ML IV SOLN
0.1000 ug/kg/h | INTRAVENOUS | Status: DC
  Filled 2012-08-04: qty 100

## 2012-08-04 MED ORDER — SODIUM CHLORIDE 0.9 % IV SOLN
INTRAVENOUS | Status: DC
  Filled 2012-08-04: qty 1

## 2012-08-05 ENCOUNTER — Encounter (HOSPITAL_COMMUNITY): Payer: Self-pay | Admitting: *Deleted

## 2012-08-05 ENCOUNTER — Encounter (HOSPITAL_COMMUNITY): Payer: Self-pay | Admitting: Anesthesiology

## 2012-08-05 ENCOUNTER — Inpatient Hospital Stay (HOSPITAL_COMMUNITY)
Admission: RE | Admit: 2012-08-05 | Discharge: 2012-08-11 | DRG: 238 | Disposition: A | Discharge status: Home / Self Care | Payer: Medicare HMO | Source: Ambulatory Visit | Attending: Surgery | Admitting: Surgery

## 2012-08-05 ENCOUNTER — Encounter (HOSPITAL_COMMUNITY)
Admission: RE | Disposition: A | Discharge status: Home / Self Care | Payer: Self-pay | Source: Ambulatory Visit | Attending: Surgery

## 2012-08-05 ENCOUNTER — Inpatient Hospital Stay (HOSPITAL_COMMUNITY): Payer: Medicare HMO

## 2012-08-05 ENCOUNTER — Ambulatory Visit (HOSPITAL_COMMUNITY): Payer: Medicare HMO | Admitting: Anesthesiology

## 2012-08-05 DIAGNOSIS — Z87891 Personal history of nicotine dependence: Secondary | ICD-10-CM

## 2012-08-05 DIAGNOSIS — I998 Other disorder of circulatory system: Secondary | ICD-10-CM | POA: Diagnosis not present

## 2012-08-05 DIAGNOSIS — Z01812 Encounter for preprocedural laboratory examination: Secondary | ICD-10-CM

## 2012-08-05 DIAGNOSIS — Z950 Presence of cardiac pacemaker: Secondary | ICD-10-CM

## 2012-08-05 DIAGNOSIS — Z79899 Other long term (current) drug therapy: Secondary | ICD-10-CM

## 2012-08-05 DIAGNOSIS — E7800 Pure hypercholesterolemia, unspecified: Secondary | ICD-10-CM | POA: Diagnosis present

## 2012-08-05 DIAGNOSIS — I714 Abdominal aortic aneurysm, without rupture, unspecified: Principal | ICD-10-CM | POA: Diagnosis present

## 2012-08-05 DIAGNOSIS — E785 Hyperlipidemia, unspecified: Secondary | ICD-10-CM | POA: Diagnosis present

## 2012-08-05 DIAGNOSIS — I4891 Unspecified atrial fibrillation: Secondary | ICD-10-CM | POA: Diagnosis present

## 2012-08-05 DIAGNOSIS — Z7982 Long term (current) use of aspirin: Secondary | ICD-10-CM

## 2012-08-05 DIAGNOSIS — D62 Acute posthemorrhagic anemia: Secondary | ICD-10-CM | POA: Diagnosis not present

## 2012-08-05 DIAGNOSIS — Z8249 Family history of ischemic heart disease and other diseases of the circulatory system: Secondary | ICD-10-CM

## 2012-08-05 DIAGNOSIS — Y9210 Unspecified residential institution as the place of occurrence of the external cause: Secondary | ICD-10-CM | POA: Diagnosis not present

## 2012-08-05 DIAGNOSIS — T82898A Other specified complication of vascular prosthetic devices, implants and grafts, initial encounter: Secondary | ICD-10-CM

## 2012-08-05 DIAGNOSIS — I10 Essential (primary) hypertension: Secondary | ICD-10-CM | POA: Diagnosis present

## 2012-08-05 DIAGNOSIS — I6529 Occlusion and stenosis of unspecified carotid artery: Secondary | ICD-10-CM | POA: Diagnosis present

## 2012-08-05 DIAGNOSIS — Y832 Surgical operation with anastomosis, bypass or graft as the cause of abnormal reaction of the patient, or of later complication, without mention of misadventure at the time of the procedure: Secondary | ICD-10-CM | POA: Diagnosis not present

## 2012-08-05 DIAGNOSIS — I495 Sick sinus syndrome: Secondary | ICD-10-CM | POA: Diagnosis present

## 2012-08-05 HISTORY — PX: ABDOMINAL AORTIC ANEURYSM REPAIR: SHX42

## 2012-08-05 HISTORY — PX: FEMORAL ARTERY EXPLORATION: SHX5160

## 2012-08-05 HISTORY — PX: ABDOMINAL AORTIC ANEURYSM REPAIR: SUR1152

## 2012-08-05 LAB — PROTIME-INR
INR: 1.44 (ref 0.00–1.49)
Prothrombin Time: 17.8 seconds — ABNORMAL HIGH (ref 11.6–15.2)

## 2012-08-05 LAB — CBC
Hemoglobin: 11.4 g/dL — ABNORMAL LOW (ref 13.0–17.0)
MCH: 33.3 pg (ref 26.0–34.0)
Platelets: 121 10*3/uL — ABNORMAL LOW (ref 150–400)
RBC: 3.42 MIL/uL — ABNORMAL LOW (ref 4.22–5.81)
WBC: 8.9 10*3/uL (ref 4.0–10.5)

## 2012-08-05 LAB — BASIC METABOLIC PANEL
CO2: 27 mEq/L (ref 19–32)
Calcium: 7.6 mg/dL — ABNORMAL LOW (ref 8.4–10.5)
Chloride: 103 mEq/L (ref 96–112)
Glucose, Bld: 194 mg/dL — ABNORMAL HIGH (ref 70–99)
Potassium: 4.3 mEq/L (ref 3.5–5.1)
Sodium: 138 mEq/L (ref 135–145)

## 2012-08-05 LAB — MAGNESIUM: Magnesium: 1.7 mg/dL (ref 1.5–2.5)

## 2012-08-05 LAB — APTT: aPTT: 31 seconds (ref 24–37)

## 2012-08-05 LAB — POCT I-STAT 3, ART BLOOD GAS (G3+)
Bicarbonate: 26 mEq/L — ABNORMAL HIGH (ref 20.0–24.0)
O2 Saturation: 97 %
Patient temperature: 97.5
pO2, Arterial: 89 mmHg (ref 80.0–100.0)

## 2012-08-05 LAB — PREPARE RBC (CROSSMATCH)

## 2012-08-05 SURGERY — ANEURYSM ABDOMINAL AORTIC REPAIR
Anesthesia: General | Site: Abdomen | Wound class: Clean

## 2012-08-05 MED ORDER — PHENOL 1.4 % MT LIQD: 1.0000 | OROMUCOSAL | Status: DC | PRN

## 2012-08-05 MED ORDER — DOCUSATE SODIUM 100 MG PO CAPS: 100.0000 mg | ORAL_CAPSULE | Freq: Every day | ORAL | Status: DC

## 2012-08-05 MED ORDER — LIDOCAINE HCL (CARDIAC) 20 MG/ML IV SOLN
INTRAVENOUS | Status: DC | PRN
  Administered 2012-08-05: 20 mg via INTRAVENOUS
  Administered 2012-08-05: 80 mg via INTRAVENOUS

## 2012-08-05 MED ORDER — LACTATED RINGERS IV SOLN
INTRAVENOUS | Status: DC | PRN
  Administered 2012-08-05 (×4): via INTRAVENOUS

## 2012-08-05 MED ORDER — SODIUM CHLORIDE 0.9 % IV SOLN
INTRAVENOUS | Status: DC | PRN
  Administered 2012-08-05 (×3): via INTRAVENOUS

## 2012-08-05 MED ORDER — PROPOFOL 10 MG/ML IV BOLUS
INTRAVENOUS | Status: DC | PRN
  Administered 2012-08-05 (×2): 20 mg via INTRAVENOUS
  Administered 2012-08-05: 100 mg via INTRAVENOUS

## 2012-08-05 MED ORDER — MIDAZOLAM HCL 2 MG/2ML IJ SOLN: 1.0000 mg | INTRAMUSCULAR | Status: DC | PRN

## 2012-08-05 MED ORDER — 0.9 % SODIUM CHLORIDE (POUR BTL) OPTIME
TOPICAL | Status: DC | PRN
  Administered 2012-08-05 (×2): 1000 mL
  Administered 2012-08-05: 2000 mL
  Administered 2012-08-05: 1000 mL

## 2012-08-05 MED ORDER — FENTANYL CITRATE 0.05 MG/ML IJ SOLN: 50.0000 ug | INTRAMUSCULAR | Status: DC | PRN

## 2012-08-05 MED ORDER — SODIUM CHLORIDE 0.9 % IV SOLN: INTRAVENOUS | Status: DC

## 2012-08-05 MED ORDER — MORPHINE SULFATE 2 MG/ML IJ SOLN
2.0000 mg | INTRAMUSCULAR | Status: DC | PRN
  Administered 2012-08-06: 4 mg via INTRAVENOUS
  Administered 2012-08-06 (×2): 2 mg via INTRAVENOUS
  Administered 2012-08-06: 4 mg via INTRAVENOUS
  Administered 2012-08-06: 2 mg via INTRAVENOUS
  Administered 2012-08-06: 4 mg via INTRAVENOUS
  Administered 2012-08-06: 2 mg via INTRAVENOUS
  Administered 2012-08-07 (×7): 4 mg via INTRAVENOUS
  Administered 2012-08-08: 2 mg via INTRAVENOUS
  Administered 2012-08-08 (×2): 4 mg via INTRAVENOUS
  Administered 2012-08-08 – 2012-08-09 (×4): 2 mg via INTRAVENOUS
  Filled 2012-08-05: qty 2
  Filled 2012-08-05: qty 1
  Filled 2012-08-05: qty 2
  Filled 2012-08-05 (×2): qty 1
  Filled 2012-08-05 (×3): qty 2
  Filled 2012-08-05: qty 1
  Filled 2012-08-05 (×4): qty 2
  Filled 2012-08-05: qty 1
  Filled 2012-08-05: qty 2
  Filled 2012-08-05: qty 1
  Filled 2012-08-05 (×2): qty 2
  Filled 2012-08-05 (×3): qty 1

## 2012-08-05 MED ORDER — ONDANSETRON HCL 4 MG/2ML IJ SOLN: INTRAMUSCULAR | Status: DC | PRN

## 2012-08-05 MED ORDER — METOPROLOL TARTRATE 1 MG/ML IV SOLN
2.0000 mg | INTRAVENOUS | Status: DC | PRN
  Administered 2012-08-08: 5 mg via INTRAVENOUS
  Filled 2012-08-05 (×3): qty 5

## 2012-08-05 MED ORDER — HYDRALAZINE HCL 20 MG/ML IJ SOLN
10.0000 mg | INTRAMUSCULAR | Status: DC | PRN
  Filled 2012-08-05: qty 0.5

## 2012-08-05 MED ORDER — SODIUM CHLORIDE 0.9 % IV SOLN
200.0000 ug | INTRAVENOUS | Status: DC | PRN
  Administered 2012-08-05: .7 ug/kg/h via INTRAVENOUS

## 2012-08-05 MED ORDER — PROMETHAZINE HCL 25 MG/ML IJ SOLN: 6.2500 mg | INTRAMUSCULAR | Status: DC | PRN

## 2012-08-05 MED ORDER — PANTOPRAZOLE SODIUM 40 MG PO TBEC: 40.0000 mg | DELAYED_RELEASE_TABLET | Freq: Every day | ORAL | Status: DC

## 2012-08-05 MED ORDER — LABETALOL HCL 5 MG/ML IV SOLN
10.0000 mg | INTRAVENOUS | Status: DC | PRN
  Filled 2012-08-05: qty 4

## 2012-08-05 MED ORDER — PROTAMINE SULFATE 10 MG/ML IV SOLN
INTRAVENOUS | Status: DC | PRN
  Administered 2012-08-05: 50 mg via INTRAVENOUS
  Administered 2012-08-05 (×3): 10 mg via INTRAVENOUS
  Administered 2012-08-05: 20 mg via INTRAVENOUS

## 2012-08-05 MED ORDER — NITROGLYCERIN IN D5W 200-5 MCG/ML-% IV SOLN
INTRAVENOUS | Status: DC | PRN
  Administered 2012-08-05: 5 ug/min via INTRAVENOUS

## 2012-08-05 MED ORDER — SODIUM CHLORIDE 0.9 % IV SOLN
500.0000 mL | Freq: Once | INTRAVENOUS | Status: AC | PRN
  Administered 2012-08-05: 500 mL via INTRAVENOUS

## 2012-08-05 MED ORDER — LORAZEPAM 2 MG/ML IJ SOLN
INTRAMUSCULAR | Status: AC
  Filled 2012-08-05: qty 1

## 2012-08-05 MED ORDER — DEXTROSE 5 % IV SOLN
1.5000 g | Freq: Two times a day (BID) | INTRAVENOUS | Status: AC
  Administered 2012-08-05 – 2012-08-06 (×2): 1.5 g via INTRAVENOUS
  Filled 2012-08-05 (×2): qty 1.5

## 2012-08-05 MED ORDER — ALUM & MAG HYDROXIDE-SIMETH 200-200-20 MG/5ML PO SUSP: 15.0000 mL | ORAL | Status: DC | PRN

## 2012-08-05 MED ORDER — GUAIFENESIN-DM 100-10 MG/5ML PO SYRP: 15.0000 mL | ORAL_SOLUTION | ORAL | Status: DC | PRN

## 2012-08-05 MED ORDER — ONDANSETRON HCL 4 MG/2ML IJ SOLN
INTRAMUSCULAR | Status: DC | PRN
  Administered 2012-08-05: 4 mg via INTRAVENOUS

## 2012-08-05 MED ORDER — SODIUM BICARBONATE 4.2 % IV SOLN
INTRAVENOUS | Status: DC | PRN
  Administered 2012-08-05 (×2): 50 meq via INTRAVENOUS

## 2012-08-05 MED ORDER — VECURONIUM BROMIDE 10 MG IV SOLR
INTRAVENOUS | Status: DC | PRN
  Administered 2012-08-05: 3 mg via INTRAVENOUS
  Administered 2012-08-05: 7 mg via INTRAVENOUS
  Administered 2012-08-05: 4 mg via INTRAVENOUS
  Administered 2012-08-05: 2 mg via INTRAVENOUS

## 2012-08-05 MED ORDER — HEPARIN SODIUM (PORCINE) 1000 UNIT/ML IJ SOLN
INTRAMUSCULAR | Status: AC
  Filled 2012-08-05: qty 1

## 2012-08-05 MED ORDER — DEXTROSE 5 % IV SOLN
0.7500 g | INTRAVENOUS | Status: DC | PRN
  Administered 2012-08-05: .75 g via INTRAVENOUS

## 2012-08-05 MED ORDER — MAGNESIUM SULFATE 40 MG/ML IJ SOLN: 2.0000 g | Freq: Once | INTRAMUSCULAR | Status: AC | PRN

## 2012-08-05 MED ORDER — MANNITOL 25 % IV SOLN
INTRAVENOUS | Status: DC | PRN
  Administered 2012-08-05: 25 g via INTRAVENOUS

## 2012-08-05 MED ORDER — KCL IN DEXTROSE-NACL 20-5-0.45 MEQ/L-%-% IV SOLN
INTRAVENOUS | Status: DC
  Administered 2012-08-05 – 2012-08-09 (×5): via INTRAVENOUS
  Filled 2012-08-05 (×14): qty 1000

## 2012-08-05 MED ORDER — HEPARIN SODIUM (PORCINE) 1000 UNIT/ML IJ SOLN
INTRAMUSCULAR | Status: DC | PRN
  Administered 2012-08-05: 6000 [IU] via INTRAVENOUS
  Administered 2012-08-05: 7000 [IU] via INTRAVENOUS
  Administered 2012-08-05 (×2): 2000 [IU] via INTRAVENOUS

## 2012-08-05 MED ORDER — FENTANYL CITRATE 0.05 MG/ML IJ SOLN
INTRAMUSCULAR | Status: DC | PRN
  Administered 2012-08-05: 25 ug via INTRAVENOUS
  Administered 2012-08-05 (×4): 50 ug via INTRAVENOUS
  Administered 2012-08-05: 25 ug via INTRAVENOUS
  Administered 2012-08-05: 150 ug via INTRAVENOUS
  Administered 2012-08-05 (×3): 50 ug via INTRAVENOUS
  Administered 2012-08-05 (×2): 100 ug via INTRAVENOUS

## 2012-08-05 MED ORDER — ACETAMINOPHEN 650 MG RE SUPP
325.0000 mg | RECTAL | Status: DC | PRN
  Filled 2012-08-05: qty 1

## 2012-08-05 MED ORDER — HYDROMORPHONE HCL PF 1 MG/ML IJ SOLN
0.2500 mg | INTRAMUSCULAR | Status: DC | PRN
  Administered 2012-08-05 – 2012-08-06 (×4): 0.5 mg via INTRAVENOUS
  Filled 2012-08-05 (×4): qty 1

## 2012-08-05 MED ORDER — POTASSIUM CHLORIDE CRYS ER 20 MEQ PO TBCR: 20.0000 meq | EXTENDED_RELEASE_TABLET | Freq: Once | ORAL | Status: AC | PRN

## 2012-08-05 MED ORDER — SODIUM CHLORIDE 0.9 % IR SOLN
Status: DC | PRN
  Administered 2012-08-05: 09:00:00

## 2012-08-05 MED ORDER — DOPAMINE-DEXTROSE 3.2-5 MG/ML-% IV SOLN
INTRAVENOUS | Status: DC | PRN
  Administered 2012-08-05: 3 ug/kg/min via INTRAVENOUS

## 2012-08-05 MED ORDER — ROCURONIUM BROMIDE 100 MG/10ML IV SOLN
INTRAVENOUS | Status: DC | PRN
  Administered 2012-08-05: 50 mg via INTRAVENOUS
  Administered 2012-08-05 (×3): 20 mg via INTRAVENOUS
  Administered 2012-08-05 (×2): 10 mg via INTRAVENOUS
  Administered 2012-08-05: 50 mg via INTRAVENOUS

## 2012-08-05 MED ORDER — ONDANSETRON HCL 4 MG/2ML IJ SOLN
4.0000 mg | Freq: Four times a day (QID) | INTRAMUSCULAR | Status: DC | PRN
  Filled 2012-08-05: qty 2

## 2012-08-05 MED ORDER — ALBUMIN HUMAN 5 % IV SOLN
INTRAVENOUS | Status: DC | PRN
  Administered 2012-08-05 (×3): via INTRAVENOUS

## 2012-08-05 MED ORDER — DOPAMINE-DEXTROSE 3.2-5 MG/ML-% IV SOLN: 3.0000 ug/kg/min | INTRAVENOUS | Status: DC

## 2012-08-05 MED ORDER — PANTOPRAZOLE SODIUM 40 MG IV SOLR
40.0000 mg | INTRAVENOUS | Status: DC
  Administered 2012-08-05 – 2012-08-09 (×5): 40 mg via INTRAVENOUS
  Filled 2012-08-05 (×7): qty 40

## 2012-08-05 MED ORDER — MIDAZOLAM HCL 5 MG/5ML IJ SOLN
INTRAMUSCULAR | Status: DC | PRN
  Administered 2012-08-05 (×2): 2 mg via INTRAVENOUS

## 2012-08-05 MED ORDER — HEMOSTATIC AGENTS (NO CHARGE) OPTIME
TOPICAL | Status: DC | PRN
  Administered 2012-08-05: 3 via TOPICAL
  Administered 2012-08-05: 4 via TOPICAL

## 2012-08-05 MED ORDER — OXYCODONE HCL 5 MG PO TABS
5.0000 mg | ORAL_TABLET | ORAL | Status: DC | PRN
  Administered 2012-08-09 – 2012-08-10 (×2): 5 mg via ORAL
  Filled 2012-08-05: qty 1
  Filled 2012-08-05: qty 2
  Filled 2012-08-05 (×2): qty 1

## 2012-08-05 MED ORDER — PHENYLEPHRINE HCL 10 MG/ML IJ SOLN
10.0000 mg | INTRAMUSCULAR | Status: DC | PRN
  Administered 2012-08-05 (×2): 10 ug/min via INTRAVENOUS

## 2012-08-05 MED ORDER — PROPOFOL 10 MG/ML IV EMUL
INTRAVENOUS | Status: DC | PRN
  Administered 2012-08-05: 200 mg via INTRAVENOUS

## 2012-08-05 MED ORDER — SODIUM CHLORIDE 0.9 % IR SOLN
Status: DC | PRN
  Administered 2012-08-05: 15:00:00

## 2012-08-05 MED ORDER — ACETAMINOPHEN 325 MG PO TABS
325.0000 mg | ORAL_TABLET | ORAL | Status: DC | PRN
  Filled 2012-08-05: qty 2

## 2012-08-05 MED ORDER — GLYCOPYRROLATE 0.2 MG/ML IJ SOLN
INTRAMUSCULAR | Status: DC | PRN
  Administered 2012-08-05: .8 mg via INTRAVENOUS

## 2012-08-05 MED ORDER — NEOSTIGMINE METHYLSULFATE 1 MG/ML IJ SOLN
INTRAMUSCULAR | Status: DC | PRN
  Administered 2012-08-05: 5 mg via INTRAVENOUS

## 2012-08-05 MED ORDER — CEFUROXIME SODIUM 750 MG IJ SOLR
750.0000 mg | INTRAMUSCULAR | Status: DC
  Filled 2012-08-05: qty 750

## 2012-08-05 SURGICAL SUPPLY — 80 items
CANISTER SUCTION 2500CC (MISCELLANEOUS) ×6 IMPLANT
CATH EMB 5FR 80CM (CATHETERS) ×3 IMPLANT
CLIP TI MEDIUM 24 (CLIP) ×6 IMPLANT
CLIP TI WIDE RED SMALL 24 (CLIP) ×6 IMPLANT
CLOTH BEACON ORANGE TIMEOUT ST (SAFETY) ×6 IMPLANT
COVER MAYO STAND STRL (DRAPES) ×3 IMPLANT
COVER SURGICAL LIGHT HANDLE (MISCELLANEOUS) ×6 IMPLANT
DERMABOND ADHESIVE PROPEN (GAUZE/BANDAGES/DRESSINGS) ×2
DERMABOND ADVANCED (GAUZE/BANDAGES/DRESSINGS) ×5
DERMABOND ADVANCED .7 DNX12 (GAUZE/BANDAGES/DRESSINGS) ×10 IMPLANT
DERMABOND ADVANCED .7 DNX6 (GAUZE/BANDAGES/DRESSINGS) ×4 IMPLANT
DRAPE WARM FLUID 44X44 (DRAPE) ×6 IMPLANT
ELECT BLADE 4.0 EZ CLEAN MEGAD (MISCELLANEOUS) ×6
ELECT BLADE 6.5 EXT (BLADE) IMPLANT
ELECT REM PT RETURN 9FT ADLT (ELECTROSURGICAL) ×3
ELECTRODE BLDE 4.0 EZ CLN MEGD (MISCELLANEOUS) ×4 IMPLANT
ELECTRODE REM PT RTRN 9FT ADLT (ELECTROSURGICAL) ×2 IMPLANT
FELT TEFLON 4 X1 (Mesh General) ×3 IMPLANT
GEL ULTRASOUND 20GR AQUASONIC (MISCELLANEOUS) IMPLANT
GLOVE BIO SURGEON STRL SZ 6.5 (GLOVE) ×12 IMPLANT
GLOVE BIOGEL PI IND STRL 6.5 (GLOVE) ×12 IMPLANT
GLOVE BIOGEL PI IND STRL 7.0 (GLOVE) ×10 IMPLANT
GLOVE BIOGEL PI IND STRL 7.5 (GLOVE) ×6 IMPLANT
GLOVE BIOGEL PI INDICATOR 6.5 (GLOVE) ×6
GLOVE BIOGEL PI INDICATOR 7.0 (GLOVE) ×5
GLOVE BIOGEL PI INDICATOR 7.5 (GLOVE) ×3
GLOVE ECLIPSE 6.0 STRL STRAW (GLOVE) ×9 IMPLANT
GLOVE ECLIPSE 7.0 STRL STRAW (GLOVE) ×15 IMPLANT
GLOVE ORTHOPEDIC STR SZ6.5 (GLOVE) ×3 IMPLANT
GLOVE SS BIOGEL STRL SZ 7.5 (GLOVE) ×2 IMPLANT
GLOVE SUPERSENSE BIOGEL SZ 7.5 (GLOVE) ×1
GLOVE SURG SS PI 6.5 STRL IVOR (GLOVE) ×9 IMPLANT
GLOVE SURG SS PI 7.0 STRL IVOR (GLOVE) ×3 IMPLANT
GLOVE SURG SS PI 7.5 STRL IVOR (GLOVE) ×12 IMPLANT
GOWN EXTRA PROTECTION XL (GOWNS) ×9 IMPLANT
GOWN PREVENTION PLUS XXLARGE (GOWN DISPOSABLE) ×6 IMPLANT
GOWN STRL NON-REIN LRG LVL3 (GOWN DISPOSABLE) ×27 IMPLANT
GRAFT HEMASHIELD 16X8MM (Vascular Products) ×3 IMPLANT
GRAFT HEMASHIELD 8MM (Vascular Products) ×1 IMPLANT
GRAFT VASC STRG 30X8KNIT (Vascular Products) ×2 IMPLANT
HEMOSTAT SNOW SURGICEL 2X4 (HEMOSTASIS) ×12 IMPLANT
HEMOSTAT SURGICEL 2X14 (HEMOSTASIS) IMPLANT
INSERT FOGARTY 61MM (MISCELLANEOUS) ×6 IMPLANT
INSERT FOGARTY SM (MISCELLANEOUS) ×12 IMPLANT
KIT BASIN OR (CUSTOM PROCEDURE TRAY) ×6 IMPLANT
KIT ROOM TURNOVER OR (KITS) ×3 IMPLANT
NS IRRIG 1000ML POUR BTL (IV SOLUTION) ×15 IMPLANT
PACK AORTA (CUSTOM PROCEDURE TRAY) ×6 IMPLANT
PAD ARMBOARD 7.5X6 YLW CONV (MISCELLANEOUS) ×9 IMPLANT
PUNCH AORTIC ROTATE 5MM 8IN (MISCELLANEOUS) ×3 IMPLANT
SPECIMEN JAR MEDIUM (MISCELLANEOUS) ×3 IMPLANT
SUT ETHIBOND 5 LR DA (SUTURE) ×9 IMPLANT
SUT PDS AB 1 TP1 54 (SUTURE) ×12 IMPLANT
SUT PROLENE 3 0 SH 48 (SUTURE) ×3 IMPLANT
SUT PROLENE 4 0 RB 1 (SUTURE) ×1
SUT PROLENE 4-0 RB1 .5 CRCL 36 (SUTURE) ×2 IMPLANT
SUT PROLENE 5 0 C 1 24 (SUTURE) IMPLANT
SUT PROLENE 5 0 C 1 36 (SUTURE) ×36 IMPLANT
SUT PROLENE 6 0 BV (SUTURE) ×6 IMPLANT
SUT PROLENE 6 0 C 1 30 (SUTURE) ×3 IMPLANT
SUT SILK 2 0 (SUTURE) ×1
SUT SILK 2 0SH CR/8 30 (SUTURE) ×3 IMPLANT
SUT SILK 2-0 18XBRD TIE 12 (SUTURE) ×2 IMPLANT
SUT SILK 3 0 (SUTURE) ×1
SUT SILK 3-0 18XBRD TIE 12 (SUTURE) ×2 IMPLANT
SUT SILK 4 0 (SUTURE) ×1
SUT SILK 4-0 18XBRD TIE 12 (SUTURE) ×2 IMPLANT
SUT SILK 5-0 PERMA NAB BK 18 (SUTURE) ×3 IMPLANT
SUT VIC AB 2-0 CT1 27 (SUTURE) ×6
SUT VIC AB 2-0 CT1 36 (SUTURE) ×9 IMPLANT
SUT VIC AB 2-0 CT1 TAPERPNT 27 (SUTURE) ×12 IMPLANT
SUT VIC AB 3-0 SH 27 (SUTURE) ×6
SUT VIC AB 3-0 SH 27X BRD (SUTURE) ×12 IMPLANT
SUT VICRYL 4-0 PS2 18IN ABS (SUTURE) ×12 IMPLANT
SYR 3ML LL SCALE MARK (SYRINGE) ×3 IMPLANT
TOWEL BLUE STERILE X RAY DET (MISCELLANEOUS) ×12 IMPLANT
TOWEL OR 17X24 6PK STRL BLUE (TOWEL DISPOSABLE) ×12 IMPLANT
TOWEL OR 17X26 10 PK STRL BLUE (TOWEL DISPOSABLE) ×6 IMPLANT
TRAY FOLEY CATH 14FRSI W/METER (CATHETERS) ×3 IMPLANT
WATER STERILE IRR 1000ML POUR (IV SOLUTION) ×9 IMPLANT

## 2012-08-05 NOTE — OR Nursing (Signed)
Notified Volunteer desk  to update family @1228 

## 2012-08-05 NOTE — Procedures (Signed)
Extubation Procedure Note  Patient Details:   Name: [name] DOB: 11/25/1936 MRN: [id number]   Airway Documentation:     Evaluation  O2 sats: stable throughout Complications: No apparent complications Patient did tolerate procedure well. Bilateral Breath Sounds: Clear;Diminished   Yes  Pt. Extubated to 4L [place]. Pt. Performed 1.0L on the vital capacity and -44 on the NIF. Pt. Performed 750x5 on the IS. Positive leak test, good gag reflex. No complications for now.  Adela Ports 08/05/2012, 8:44 PM

## 2012-08-05 NOTE — Progress Notes (Signed)
Wean initiated. 

## 2012-08-05 NOTE — OR Nursing (Signed)
Procedure 1 AAA end at 1407. Procedure 2 Exploration of Femoral Artery start at 1453.

## 2012-08-05 NOTE — Transfer of Care (Signed)
Immediate Anesthesia Transfer of Care Note  Patient: Omar Morales  Procedure(s) Performed: Procedure(s) (LRB) with comments: ANEURYSM ABDOMINAL AORTIC REPAIR (N/A) - Open Abdominal Aortic  Aneurysm Repair and Renal Artery Bypass times 2 FEMORAL ARTERY EXPLORATION (N/A)  Patient Location: SICU  Anesthesia Type: General  Level of Consciousness: sedated  Airway & Oxygen Therapy: Patient remains intubated per anesthesia plan  Post-op Assessment: Post -op Vital signs reviewed and stable  Post vital signs: stable  Complications: No apparent anesthesia complications

## 2012-08-05 NOTE — Anesthesia Preprocedure Evaluation (Addendum)
Anesthesia Evaluation  Patient identified by MRN, date of birth, ID band Patient awake    Reviewed: Allergy & Precautions, H&P , NPO status , Patient's Chart, lab work & pertinent test results, reviewed documented beta blocker date and time   Airway Mallampati: I TM Distance: >3 FB Neck ROM: Full    Dental  (+) Edentulous Upper, Dental Advisory Given and Poor Dentition   Pulmonary former smoker,  breath sounds clear to auscultation  Pulmonary exam normal       Cardiovascular hypertension, Pt. on medications and Pt. on home beta blockers + dysrhythmias Atrial Fibrillation Rhythm:Regular Rate:Normal  freq PVCs   Neuro/Psych    GI/Hepatic   Endo/Other    Renal/GU      Musculoskeletal   Abdominal (+)  Abdomen: soft. Bowel sounds: normal.  Peds  Hematology   Anesthesia Other Findings   Reproductive/Obstetrics                       Anesthesia Physical Anesthesia Plan  ASA: III  Anesthesia Plan: General   Post-op Pain Management:    Induction: Intravenous  Airway Management Planned: Oral ETT  Additional Equipment: Arterial line and CVP  Intra-op Plan:   Post-operative Plan: Possible Post-op intubation/ventilation  Informed Consent: I have reviewed the patients History and Physical, chart, labs and discussed the procedure including the risks, benefits and alternatives for the proposed anesthesia with the patient or authorized representative who has indicated his/her understanding and acceptance.     Plan Discussed with: CRNA and Surgeon  Anesthesia Plan Comments:        Anesthesia Quick Evaluation

## 2012-08-05 NOTE — H&P (Signed)
[facility] [place]  Patient name: Omar Morales MRN: [id number] DOB: [birth year] Sex: male  Chief Complaint   Patient presents with   .  AAA     Pre / Op visit    HISTORY OF PRESENT ILLNESS:  The patient comes in today for further discussions of his abdominal aortic aneurysm. He has had no abdominal or back pain since his angiogram.  Past Medical History   Diagnosis  Date   .  Hypertension    .  Hypercholesterolemia    .  Arrhythmia      cardiac arrythmia   .  AAA (abdominal aortic aneurysm)      followed by Dr [name]   .  Sleep disorder    .  Sinus node dysfunction    .  Irregular heart beat     Past Surgical History   Procedure  Date   .  Tonsillectomy    .  Tibia fracture surgery      repair   .  Pacemaker insertion  12/10/09     MDT Adaptal L implanted by Dr [name]   .  Aortagram  06/09/12     Abdominal Aortagram   .  Fracture surgery  1942     Broken leg Right   .  Appendectomy  1947    History    Social History   .  Marital Status:  Married     Spouse Name:  N/A     Number of Children:  N/A   .  Years of Education:  N/A    Occupational History   .  Not on file.    Social History Main Topics   .  Smoking status:  Former Smoker -- 1.0 packs/day for 50 years     Types:  Cigarettes     Quit date:  08/01/2009   .  Smokeless tobacco:  Never Used   .  Alcohol Use:  No   .  Drug Use:  No   .  Sexually Active:  Not on file    Other Topics  Concern   .  Not on file    Social History Narrative   .  No narrative on file    Family History   Problem  Relation  Age of Onset   .  Heart disease  Father    .  Heart attack  Father    .  Hyperlipidemia  Mother     Allergies as of 07/05/2012   .  (No Known Allergies)    Current Outpatient Prescriptions on File Prior to Visit   Medication  Sig  Dispense  Refill   .  aspirin 81 MG EC tablet  Take 81 mg by mouth daily.     .  chlorpheniramine (CHLOR-TRIMETON) 4 MG tablet  Take 4 mg by  mouth as needed.     Marland Kitchen  guaiFENesin (MUCINEX) 600 MG 12 hr tablet  Take 600 mg by mouth 2 (two) times daily.     .  hydrochlorothiazide (HYDRODIURIL) 25 MG tablet  Take 12.5 mg by mouth daily.     Marland Kitchen  lisinopril (PRINIVIL,ZESTRIL) 20 MG tablet  Take 20 mg by mouth daily.     Marland Kitchen  lovastatin (MEVACOR) 40 MG tablet  Take 40 mg by mouth at bedtime.     .  metoprolol succinate (TOPROL-XL) 25 MG 24 hr tablet  Take 1 tablet (25 mg total) by mouth daily.  60 tablet  6   .  Multiple Vitamins-Minerals (MULTIVITAMIN WITH MINERALS) tablet  Take 1 tablet by mouth daily.     .  Oxymetazoline HCl (SINUS RELIEF NASAL SPRAY NA)  Place into the nose as needed.     .  Tamsulosin HCl (FLOMAX) 0.4 MG CAPS  Take 0.4 mg by mouth at bedtime.     .  fexofenadine (ALLEGRA) 180 MG tablet  Take 180 mg by mouth daily.      REVIEW OF SYSTEMS:  No change from prior visit  PHYSICAL EXAMINATION:  Vital signs are BP 103/67  Pulse 62  Temp 98.1 F (36.7 C) (Oral)  Resp 16  Ht 5\' 10"  (1.778 m)  Wt 191 lb (86.637 kg)  BMI 27.41 kg/m2  SpO2 98%  General: The patient appears their stated age.  HEENT: No gross abnormalities  Pulmonary: Non labored breathing  Abdomen: Soft and non-tender  Musculoskeletal: There are no major deformities.  Neurologic: No focal weakness or paresthesias are detected,  Skin: There are no ulcer or rashes noted.  Psychiatric: The patient has normal affect.  Cardiovascular: There is a regular rate and rhythm without significant murmur appreciated. 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 I spent a greater than one hour reviewing the patient's studies and discussing this with he and his wife. We discussed all of the possible  complications including but not limited to the risk of death, cardiopulmonary complications, infection, renal insufficiency, intestinal ischemia, lower extremity ischemia, bleeding, and infection. All his questions were answered. My plan is to use a aortobiiliac graft and reimplantan aberrant  right renal artery. I did discuss this may require a bypass to the right renal artery. The operation has been scheduled for Thursday, September 5. He has already had cardiac clearance    V. Charlena Cross, M.D.  [facility] [place]  Office: [phone]  Pager: [phone]

## 2012-08-05 NOTE — Anesthesia Postprocedure Evaluation (Signed)
  Anesthesia Post-op Note  Patient: Omar Morales  Procedure(s) Performed: Procedure(s) (LRB) with comments: ANEURYSM ABDOMINAL AORTIC REPAIR (N/A) - Open Abdominal Aortic  Aneurysm Repair and Renal Artery Bypass times 2 FEMORAL ARTERY EXPLORATION (N/A)  Patient Location: SICU  Anesthesia Type: General  Level of Consciousness: sedated  Airway and Oxygen Therapy: Patient remains intubated per anesthesia plan  Post-op Pain: none  Post-op Assessment: Post-op Vital signs reviewed  Post-op Vital Signs: stable  Complications: No apparent anesthesia complications

## 2012-08-05 NOTE — Anesthesia Procedure Notes (Signed)
Procedure Name: Intubation Date/Time: 08/05/2012 8:42 AM Performed by: Ellin Goodie Pre-anesthesia Checklist: Patient identified, Emergency Drugs available, Suction available, Patient being monitored and Timeout performed Patient Re-evaluated:Patient Re-evaluated prior to inductionOxygen Delivery Method: Circle system utilized Preoxygenation: Pre-oxygenation with 100% oxygen Intubation Type: IV induction Ventilation: Mask ventilation without difficulty Laryngoscope Size: Mac and 3 Grade View: Grade I Tube type: Oral Tube size: 7.5 mm Number of attempts: 1 Airway Equipment and Method: Stylet Placement Confirmation: ETT inserted through vocal cords under direct vision,  positive ETCO2 and breath sounds checked- equal and bilateral Secured at: 22 cm Tube secured with: Tape Dental Injury: Teeth and Oropharynx as per pre-operative assessment

## 2012-08-05 NOTE — Progress Notes (Signed)
Spoke with Leta Jungling regarding need to reprogram device for surgery states she will be in the facility at 7 am. Left number on front of chart.

## 2012-08-05 NOTE — Preoperative (Signed)
Beta Blockers   Reason not to administer Beta Blockers:Not Applicable 

## 2012-08-05 NOTE — Progress Notes (Signed)
MEDICATION RELATED CONSULT NOTE - INITIAL   Pharmacy Consult for Antibiotic renal dose adjustment Indication: surgical prophylaxis  No Known Allergies  Patient Measurements: Height: 5\' 10"  (177.8 cm) Weight: 190 lb (86.183 kg) IBW/kg (Calculated) : 73   Vital Signs: Temp: 97.5 F (36.4 C) (09/05 1800) Temp src: Axillary (09/05 1800) Pulse Rate: 69  (09/05 1830) Intake/Output from previous day:   Intake/Output from this shift: Total I/O In: 7701.4 [I.V.:6021.4; Blood:650; NG/GT:30; IV Piggyback:1000] Out: 3950 [Urine:1950; Blood:2000]  Labs: No results found for this basename: WBC:3,HGB:3,HCT:3,PLT:3,APTT:3;INR:3,CREATININE:3,LABCREA:3,CREATININE:3,CREAT24HRUR:3,MG:3,PHOS:3,ALBUMIN:3,PROT:3,ALBUMIN:3,AST:3,ALT:3,ALKPHOS:3,BILITOT:3,BILIDIR:3,IBILI:3 in the last 72 hours Estimated Creatinine Clearance: 62 ml/min (by C-G formula based on Cr of 1.08).   Microbiology: Recent Results (from the past 720 hour(s))  SURGICAL PCR SCREEN     Status: Normal   Collection Time   07/29/12  1:54 PM      Component Value Range Status Comment   MRSA, PCR NEGATIVE  NEGATIVE Final    Staphylococcus aureus NEGATIVE  NEGATIVE Final     Medical History: Past Medical History  Diagnosis Date  . Hypertension   . Hypercholesterolemia   . Arrhythmia     cardiac arrythmia  . AAA (abdominal aortic aneurysm)     followed by Dr [name]  . Sleep disorder   . Sinus node dysfunction   . Irregular heart beat     Assessment: 76 YOM with abdominal aortic aneurysm, admitted for open abdominal aortic anurysm repair and renal artery bypass x 2, and started zinacef 1.5g x 2 doses for post-op prophylaxis. Pre-op dose was given this morning at 0822. Scr 1.08 on 8/29, est. crcl ~ 10ml/min, zinacef dosage appropriate. No adjustment needed   Goal of Therapy:  Prevent post-op infection  Plan:  - Continue Zinacef 1.5g q 12hrs x 2 doses - Pharmacy sign off, please re-consult if additional antibiotic dosing  assistance is needed.  Thanks.  Bayard Hugger, PharmD, BCPS  Clinical Pharmacist  Pager: [phone]  08/05/2012,6:40 PM

## 2012-08-05 NOTE — Brief Op Note (Signed)
08/05/2012  10:43 PM  PATIENT:  Omar Morales  76 y.o. male  PRE-OPERATIVE DIAGNOSIS:  Abdominal Aortic Aneurysm  POST-OPERATIVE DIAGNOSIS:  Abdominal Aortic Aneurysm  PROCEDURE:  Procedure(s) (LRB) with comments: ANEURYSM ABDOMINAL AORTIC REPAIR (N/A) - Open Abdominal Aortic  Aneurysm Repair and Renal Artery Bypass times 2 FEMORAL ARTERY EXPLORATION (N/A)  SURGEON:  Surgeon(s) and Role:    * Nada Libman, MD - Primary  PHYSICIAN ASSISTANT:   ASSISTANTS: Kathie Rhodes Rhyne   ANESTHESIA:   general  EBL:     BLOOD ADMINISTERED:700 CC CELLSAVER  DRAINS: none   LOCAL MEDICATIONS USED:  NONE  SPECIMEN:  Source of Specimen:  aortic plaque  DISPOSITION OF SPECIMEN:  PATHOLOGY  COUNTS:  YES  TOURNIQUET:  * No tourniquets in log *  DICTATION: .Dragon Dictation  PLAN OF CARE: Admit to inpatient   PATIENT DISPOSITION:  ICU - intubated and hemodynamically stable.   Delay start of Pharmacological VTE agent (>24hrs) due to surgical blood loss or risk of bleeding: yes

## 2012-08-06 ENCOUNTER — Encounter (HOSPITAL_COMMUNITY): Payer: Self-pay | Admitting: Surgery

## 2012-08-06 ENCOUNTER — Inpatient Hospital Stay (HOSPITAL_COMMUNITY): Payer: Medicare HMO

## 2012-08-06 LAB — POCT I-STAT 3, ART BLOOD GAS (G3+)
Acid-Base Excess: 1 mmol/L (ref 0.0–2.0)
Bicarbonate: 27.1 mEq/L — ABNORMAL HIGH (ref 20.0–24.0)
Bicarbonate: 26.1 mEq/L — ABNORMAL HIGH (ref 20.0–24.0)
O2 Saturation: 97 %
O2 Saturation: 96 %
Patient temperature: 98.5
Patient temperature: 98.8
TCO2: 29 mmol/L (ref 0–100)
TCO2: 26 mmol/L (ref 0–100)
pH, Arterial: 7.379 (ref 7.350–7.450)
pH, Arterial: 7.431 (ref 7.350–7.450)
pO2, Arterial: 89 mmHg (ref 80.0–100.0)

## 2012-08-06 LAB — POCT I-STAT 7, (LYTES, BLD GAS, ICA,H+H)
Acid-Base Excess: 2 mmol/L (ref 0.0–2.0)
Calcium, Ion: 1.14 mmol/L (ref 1.13–1.30)
Calcium, Ion: 1.06 mmol/L — ABNORMAL LOW (ref 1.13–1.30)
HCT: 37 % — ABNORMAL LOW (ref 39.0–52.0)
Hemoglobin: 12.6 g/dL — ABNORMAL LOW (ref 13.0–17.0)
O2 Saturation: 100 %
Potassium: 4 mEq/L (ref 3.5–5.1)
Potassium: 4 mEq/L (ref 3.5–5.1)
Sodium: 137 mEq/L (ref 135–145)

## 2012-08-06 LAB — COMPREHENSIVE METABOLIC PANEL
ALT: 11 U/L (ref 0–53)
AST: 22 U/L (ref 0–37)
Albumin: 3 g/dL — ABNORMAL LOW (ref 3.5–5.2)
CO2: 27 mEq/L (ref 19–32)
Chloride: 104 mEq/L (ref 96–112)
GFR calc non Af Amer: 64 mL/min — ABNORMAL LOW (ref >90)
Potassium: 4 mEq/L (ref 3.5–5.1)
Sodium: 137 mEq/L (ref 135–145)
Total Bilirubin: 0.9 mg/dL (ref 0.3–1.2)

## 2012-08-06 LAB — CBC
Platelets: 133 10*3/uL — ABNORMAL LOW (ref 150–400)
RBC: 3.15 MIL/uL — ABNORMAL LOW (ref 4.22–5.81)
RDW: 13.5 % (ref 11.5–15.5)
WBC: 10 10*3/uL (ref 4.0–10.5)

## 2012-08-06 LAB — AMYLASE: Amylase: 218 U/L — ABNORMAL HIGH (ref 0–105)

## 2012-08-06 MED ORDER — ALTEPLASE 100 MG IV SOLR: 2.0000 mg | Freq: Once | INTRAVENOUS | Status: DC | PRN

## 2012-08-06 MED ORDER — DOCUSATE SODIUM 50 MG/5ML PO LIQD
100.0000 mg | Freq: Every day | ORAL | Status: DC
  Administered 2012-08-06 – 2012-08-08 (×3): 100 mg
  Filled 2012-08-06 (×5): qty 10

## 2012-08-06 MED FILL — Sodium Chloride Irrigation Soln 0.9%: Qty: 3000 | Status: AC

## 2012-08-06 MED FILL — Heparin Sodium (Porcine) Inj 1000 Unit/ML: INTRAMUSCULAR | Qty: 30 | Status: AC

## 2012-08-06 MED FILL — Sodium Chloride IV Soln 0.9%: INTRAVENOUS | Qty: 1000 | Status: AC

## 2012-08-06 MED FILL — Magnesium Sulfate Inj 50%: INTRAMUSCULAR | Qty: 10 | Status: AC

## 2012-08-06 MED FILL — Potassium Chloride Inj 2 mEq/ML: INTRAVENOUS | Qty: 40 | Status: AC

## 2012-08-06 MED FILL — Dexmedetomidine HCl IV Soln 200 MCG/2ML: INTRAVENOUS | Qty: 2 | Status: AC

## 2012-08-06 NOTE — Evaluation (Signed)
Physical Therapy Evaluation Patient Details Name: Omar Morales MRN: [id number] DOB: Jan 21, 1936 Today's Date: 08/06/2012 Time: 1205-1224 PT Time Calculation (min): 19 min  PT Assessment / Plan / Recommendation Clinical Impression  Pt admitted for AAA repair and jump graft of right femoral artery. Pt mobilizing well today and anticipate he will progress quickly for return home with spouse. Will follow acutely to maximize mobility, gait, transfers and safety to increase independence and decrease burden of care.     PT Assessment  Patient needs continued PT services    Follow Up Recommendations  Home health PT;Supervision for mobility/OOB    Barriers to Discharge None      Equipment Recommendations  Rolling walker with 5" wheels    Recommendations for Other Services     Frequency Min 3X/week    Precautions / Restrictions Precautions Precaution Comments: lines and NGT   Pertinent Vitals/Pain sats 88-92% on RA with activity and 91% end of session on 2L with cueing for breathing Pt reports pain with initial movement due to cramping but no pain end of session Pt guarding abdomen throughout      Mobility  Bed Mobility Bed Mobility: Rolling Right;Right Sidelying to Sit Rolling Right: 4: Min assist Right Sidelying to Sit: 4: Min assist;HOB flat Details for Bed Mobility Assistance: cueing and assist to sequence and complete transfers Transfers Transfers: Sit to Stand;Stand to Sit Sit to Stand: 4: Min guard;From bed Stand to Sit: 4: Min guard;To chair/3-in-1 Details for Transfer Assistance: cueing for hand placement and safety Ambulation/Gait Ambulation/Gait Assistance: 4: Min guard Ambulation Distance (Feet): 15 Feet Assistive device: Rolling walker Ambulation/Gait Assistance Details: cueing for posture and position in RW, pt denied further distance today Gait Pattern: Step-through pattern;Decreased stride length    Exercises     PT Diagnosis: Difficulty walking;Acute  pain  PT Problem List: Decreased activity tolerance;Decreased mobility;Decreased knowledge of use of DME PT Treatment Interventions: DME instruction;Gait training;Functional mobility training;Therapeutic activities;Patient/family education   PT Goals Acute Rehab PT Goals PT Goal Formulation: With patient Time For Goal Achievement: 08/20/12 Potential to Achieve Goals: Good Pt will Roll Supine to Right Side: with supervision PT Goal: Rolling Supine to Right Side - Progress: Goal set today Pt will go Supine/Side to Sit: with supervision PT Goal: Supine/Side to Sit - Progress: Goal set today Pt will go Sit to Stand: with supervision PT Goal: Sit to Stand - Progress: Goal set today Pt will go Stand to Sit: with supervision PT Goal: Stand to Sit - Progress: Goal set today Pt will Ambulate: >150 feet;with modified independence;with least restrictive assistive device PT Goal: Ambulate - Progress: Goal set today Pt will Go Up / Down Stairs: 1-2 stairs;with min assist;with least restrictive assistive device PT Goal: Up/Down Stairs - Progress: Goal set today  Visit Information  Assistance Needed: +1 PT/OT Co-Evaluation/Treatment: Yes    Subjective Data  Subjective: I feel better after standing up Patient Stated Goal: return to gardening   Prior Functioning  Home Living Lives With: Spouse Available Help at Discharge: Family;Available 24 hours/day Type of Home: House Home Access: Stairs to enter [employer] of Steps: 2 Entrance Stairs-Rails: None Home Layout: Two level;Able to live on main level with bedroom/bathroom Bathroom Shower/Tub: Health visitor: Standard Home Adaptive Equipment: None Prior Function Level of Independence: Independent Able to Take Stairs?: Yes Driving: Yes Vocation: Retired Musician: H[employer]    Cognition  Overall Cognitive Status: Appears within functional limits for tasks assessed/performed Arousal/Alertness:  Awake/alert Orientation Level: Appears  intact for tasks assessed Behavior During Session: [facility] for tasks performed    Extremity/Trunk Assessment Right Lower Extremity Assessment RLE ROM/Strength/Tone: Within functional levels Left Lower Extremity Assessment LLE ROM/Strength/Tone: Within functional levels Trunk Assessment Trunk Assessment: Normal   Balance Static Sitting Balance Static Sitting - Balance Support: No upper extremity supported;Feet supported Static Sitting - Level of Assistance: 6: Modified independent (Device/Increase time) Static Sitting - Comment/# of Minutes: 2  End of Session PT - End of Session Activity Tolerance: Patient tolerated treatment well Patient left: in chair;with call bell/phone within reach Nurse Communication: Mobility status  GP     Delorse Lek 08/06/2012, 12:33 PM  Delaney Meigs, PT [phone]

## 2012-08-06 NOTE — Evaluation (Signed)
Occupational Therapy Evaluation Patient Details Name: Omar Morales MRN: [id number] DOB: 10-10-1936 Today's Date: 08/06/2012 Time: 1203-1223 OT Time Calculation (min): 20 min  OT Assessment / Plan / Recommendation Clinical Impression  Pt is recovering from AAA repair and jump graft of right femoral artery.  Moving well post op day one.  Will benefit from acute OT to address ADL, equipment needs, and instruct in energy conservation.  Anticipate pt will be able to return home with wife and HHOT.      OT Assessment  Patient needs continued OT Services    Follow Up Recommendations  Home health OT    Barriers to Discharge      Equipment Recommendations  Rolling walker with 5" wheels;3 in 1 bedside comode    Recommendations for Other Services    Frequency  Min 2X/week    Precautions / Restrictions Precautions Precautions: Fall Precaution Comments: lines and NGT Restrictions Weight Bearing Restrictions: No   Pertinent Vitals/Pain     ADL  Eating/Feeding: NPO Grooming: Performed;Wash/dry face;Brushing hair;Moderate assistance Where Assessed - Grooming: Supported sitting Upper Body Bathing: Simulated;Moderate assistance Where Assessed - Upper Body Bathing: Unsupported sitting Lower Body Bathing: Simulated;+1 Total assistance Where Assessed - Lower Body Bathing: Supported sit to stand Upper Body Dressing: Performed;Minimal assistance Where Assessed - Upper Body Dressing: Unsupported sitting Lower Body Dressing: Performed;+1 Total assistance Where Assessed - Lower Body Dressing: Supported sit to stand Equipment Used: Gait belt;Rolling walker ADL Comments: Pt limited by abdominal pain, decreased balance and generalized weakness.    OT Diagnosis: Generalized weakness;Acute pain  OT Problem List: Decreased activity tolerance;Impaired balance (sitting and/or standing);Decreased knowledge of use of DME or AE;Cardiopulmonary status limiting activity;Pain OT Treatment  Interventions: Self-care/ADL training;Energy conservation;Therapeutic activities;Patient/family education;DME and/or AE instruction   OT Goals Acute Rehab OT Goals OT Goal Formulation: With patient Time For Goal Achievement: 08/20/12 Potential to Achieve Goals: Good ADL Goals Pt Will Perform Grooming: with supervision;Standing at sink ADL Goal: Grooming - Progress: Goal set today Pt Will Perform Upper Body Bathing: with set-up;Sitting, edge of bed ADL Goal: Upper Body Bathing - Progress: Goal set today Pt Will Perform Lower Body Bathing: Sit to stand from bed;with supervision ADL Goal: Lower Body Bathing - Progress: Goal set today Pt Will Perform Upper Body Dressing: with set-up;Sitting, bed ADL Goal: Upper Body Dressing - Progress: Goal set today Pt Will Perform Lower Body Dressing: with supervision;Sit to stand from bed ADL Goal: Lower Body Dressing - Progress: Goal set today Pt Will Transfer to Toilet: with supervision;Ambulation;with DME;3-in-1 ADL Goal: Toilet Transfer - Progress: Goal set today Pt Will Perform Toileting - Clothing Manipulation: with supervision;Standing ADL Goal: Toileting - Clothing Manipulation - Progress: Goal set today Pt Will Perform Toileting - Hygiene: with modified independence;Sit to stand from 3-in-1/toilet ADL Goal: Toileting - Hygiene - Progress: Goal set today Pt Will Perform Tub/Shower Transfer: Shower transfer;with supervision;Ambulation;with DME ADL Goal: Tub/Shower Transfer - Progress: Goal set today Miscellaneous OT Goals Miscellaneous OT Goal #1: Pt will state at least 3 energy conservation strategies for use during ADL and mobility. OT Goal: Miscellaneous Goal #1 - Progress: Goal set today Miscellaneous OT Goal #2: Pt and wife will be knowledgeable of the multiple uses of 3 in 1. OT Goal: Miscellaneous Goal #2 - Progress: Goal set today  Visit Information  Last OT Received On: 08/06/12 Assistance Needed: +1 PT/OT Co-Evaluation/Treatment:  Yes    Subjective Data  Subjective: "I have spasms in my stomach." Patient Stated Goal: Return home with wife.  Prior Functioning  Vision/Perception  Home Living Lives With: Spouse Available Help at Discharge: Family;Available 24 hours/day Type of Home: House Home Access: Stairs to enter [employer] of Steps: 2 Entrance Stairs-Rails: None Home Layout: Two level;Able to live on main level with bedroom/bathroom Bathroom Shower/Tub: Health visitor: Standard Home Adaptive Equipment: None Prior Function Level of Independence: Independent Able to Take Stairs?: Yes Driving: Yes Vocation: Retired Musician: HOH Dominant Hand: Right      Cognition  Overall Cognitive Status: Appears within functional limits for tasks assessed/performed Arousal/Alertness: Awake/alert Orientation Level: Appears intact for tasks assessed Behavior During Session: [facility] for tasks performed    Extremity/Trunk Assessment Right Upper Extremity Assessment RUE ROM/Strength/Tone: [facility] for tasks assessed Left Upper Extremity Assessment LUE ROM/Strength/Tone: WFL for tasks assessed  Trunk Assessment Trunk Assessment: Normal   Mobility  Shoulder Instructions  Bed Mobility Bed Mobility: Rolling Right;Right Sidelying to Sit Rolling Right: 4: Min assist Right Sidelying to Sit: 4: Min assist;HOB flat Details for Bed Mobility Assistance: cueing and assist to sequence and complete transfers Transfers Transfers: Sit to Stand;Stand to Sit Sit to Stand: 4: Min guard;From bed Stand to Sit: 4: Min guard;To chair/3-in-1 Details for Transfer Assistance: cueing for hand placement and safety       Exercise     Balance Balance Balance Assessed: Yes Static Sitting Balance Static Sitting - Balance Support: No upper extremity supported;Feet supported Static Sitting - Level of Assistance: 6: Modified independent (Device/Increase time) Static Sitting - Comment/# of  Minutes: 2   End of Session OT - End of Session Activity Tolerance: Patient tolerated treatment well Patient left: in chair;with call bell/phone within reach Nurse Communication: Mobility status  GO     Evern Bio 08/06/2012, 1:53 PM [phone]

## 2012-08-06 NOTE — Care Management Note (Unsigned)
    Page 1 of 2   08/10/2012     3:20:30 PM   CARE MANAGEMENT NOTE 08/10/2012  Patient:  Omar Morales, Omar Morales   Account Number:  [id number]  Date Initiated:  08/06/2012  Documentation initiated by:  AMERSON,JULIE  Subjective/Objective Assessment:   PT S/P AAA REPAIR ON 08/05/12.  PTA, PT INDEPENDENT, LIVES WITH WIFE.     Action/Plan:   MET WITH PT TO DISCUSS DC PLANS.  WIFE TO PROVIDE CARE AT DC.  WILL FOLLOW FOR HOME NEEDS AS PT PROGRESSES.   Anticipated DC Date:  08/10/2012   Anticipated DC Plan:  HOME W HOME HEALTH SERVICES      DC Planning Services  CM consult      [facility] Choice  HOME HEALTH   Choice offered to / List presented to:  C-1 Patient        HH arranged  HH-2 PT      [place] Day agency  [facility]   Status of service:  In process, will continue to follow Medicare Important Message given?   (If response is "NO", the following Medicare IM given date fields will be blank) Date Medicare IM given:   Date Additional Medicare IM given:    Discharge Disposition:    Per UR Regulation:  Reviewed for med. necessity/level of care/duration of stay  If discussed at Long Length of Stay Meetings, dates discussed:    Comments:  08/10/12  1520  Aldo Sondgeroth SIMMONS RN, BSN [phone] REFERRAL PLACED TO [facility] FOR HHPT; SOC DATE: WITHIN 24-48HRS POST D/C;  NO DME NEEDED AT THIS TIME.  NCM WILL FOLLOW.  08/09/12  1516  Concepcion Kirkpatrick SIMMONS RN, BSN [phone] Pt tolerated sips and ice chips and had BM with dulcolax. Will advance diet to clear liquids and re-start his Flomax, toprol, and ACEI today.

## 2012-08-06 NOTE — Progress Notes (Signed)
Medications brought by family and sent to pharmacy:  Sinol nasal spray Sinus and allergy PE Centrum silver Bayer aspirin (81mg ) Hydrochlorot (25mg ) Lisinopril (20mg ) Metoprolol ER (25mg ) Lovastatin (40mg ) Tamsulosin (0.4mg )  List of medication times and dosages also provided by family and sent to pharmacy.

## 2012-08-06 NOTE — Progress Notes (Signed)
[facility] [place]  Subjective  - POD #1   Extubated post op in ICU after take back for poor bloodflow to right leg requiring jump graft to right femoral artery.  Complaining of 7/10 pain No flatus  Physical Exam:  Abdomen soft, incision clean Groin soft and clean Palpable pedal pulses bilaterally Right calf soft  Normal motor and sensory function to each leg   Assessment/Plan:  POD #1, s/p open AAA repair with bifurcated graft to bilateral common iliac arteries and re-implantation of 2 renal arteries to the distal tube portion of the graft as an island.  Patient was found to have no pulse in the right leg post op and was taken back and a jump graft to thte right femoral artery was performed  GU:  Good UOP cr increased to 1.1, will monitor GI:  Keep NG in place until flatus.  NPO Prophylaxis:  Lovenox Post op anemia.  Stable HCT.  Required no blood, only cell saver Cardiac:  Pacemaker reprogrammed post op.  No issues, although he has many PVC's   [place] is cardiology GEN:  OOB today D/c introducer. Decrease IVF, d/c Aline D/c foley tomorrow Keep in ICU today  BRABHAM IV, V. WELLS 08/06/2012 7:23 AM --  Filed Vitals:   08/06/12 0700  BP: 102/48  Pulse: 74  Temp:   Resp: 21    Intake/Output Summary (Last 24 hours) at 08/06/12 0723 Last data filed at 08/06/12 0600  Gross per 24 hour  Intake 9926.29 ml  Output   4600 ml  Net 5326.29 ml     Laboratory CBC    Component Value Date/Time   WBC 10.0 08/06/2012 0404   HGB 10.4* 08/06/2012 0404   HCT 30.7* 08/06/2012 0404   PLT 133* 08/06/2012 0404    BMET    Component Value Date/Time   NA 137 08/06/2012 0404   K 4.0 08/06/2012 0404   CL 104 08/06/2012 0404   CO2 27 08/06/2012 0404   GLUCOSE 182* 08/06/2012 0404   BUN 20 08/06/2012 0404   CREATININE 1.10 08/06/2012 0404   CALCIUM 7.7* 08/06/2012 0404   GFRNONAA 64* 08/06/2012 0404   GFRAA 74* 08/06/2012 0404    COAG Lab Results  Component Value Date   INR 1.44 08/05/2012   INR 1.12 07/29/2012   INR 1.1 ratio* 12/05/2009   No results found for this basename: PTT    Antibiotics Anti-infectives     Start     Dose/Rate Route Frequency Ordered Stop   08/05/12 2200   cefUROXime (ZINACEF) 1.5 g in dextrose 5 % 50 mL IVPB        1.5 g 100 mL/hr over 30 Minutes Intravenous Every 12 hours 08/05/12 1806 08/06/12 2159   08/05/12 1200   cefUROXime (ZINACEF) injection 750 mg  Status:  Discontinued        750 mg Intramuscular To Surgery 08/05/12 1159 08/05/12 1753   08/05/12 0400   vancomycin (VANCOCIN) 1,500 mg in sodium chloride 0.9 % 250 mL IVPB  Status:  Discontinued        1,500 mg 125 mL/hr over 120 Minutes Intravenous To Surgery 08/04/12 1449 08/05/12 1753   08/05/12 0400   cefUROXime (ZINACEF) 1.5 g in dextrose 5 % 50 mL IVPB        1.5 g 100 mL/hr over 30 Minutes Intravenous To Surgery 08/04/12 1449 08/05/12 0822   08/05/12 0400   cefUROXime (ZINACEF) 750 mg in dextrose 5 % 50 mL IVPB  Status:  Discontinued        750 mg 100 mL/hr over 30 Minutes Intravenous To Surgery 08/04/12 1449 08/05/12 1753   08/04/12 1441   cefUROXime (ZINACEF) 1.5 g in dextrose 5 % 50 mL IVPB  Status:  Discontinued        1.5 g 100 mL/hr over 30 Minutes Intravenous 30 min pre-op 08/04/12 1441 08/05/12 1753           V. Charlena Cross, M.D. [facility] [place] Office: [phone] Pager:  [phone]

## 2012-08-07 IMAGING — CT CT CTA ABD/PEL W/CM AND/OR W/O CM
1 of 9 series · 11 of 46 positions shown, 17 images · IV contrast ([ID] [id number])
Comparison: CT angio abdomen and pelvis of 07/29/2010

CLINICAL DATA: Abdominal aortic aneurysm, for pre stent graft
measurements

CT ANGIOGRAPHY OF ABDOMEN AND PELVIS - PRESTENT PROTOCOL
TECHNIQUE: Multidetector CT imaging of the abdomen and pelvis was
performed during bolus injection of intravenous contrast.
Multiplanar CT angiographic image reconstructions including MIPs
were also generated to evaluate the vascular anatomy.
Contrast:  100 ml [id number]-1WF

[Series 5: arterial, portal venous · axial · arterial · 0.74mm/px · z∈[-395,-40]mm · 11 of 256 slices shown, 17 images]
[im 22/256  soft-tissue]
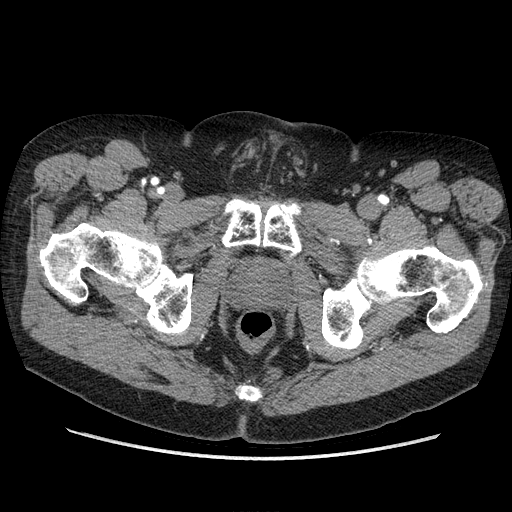
[im 22/256  bone]
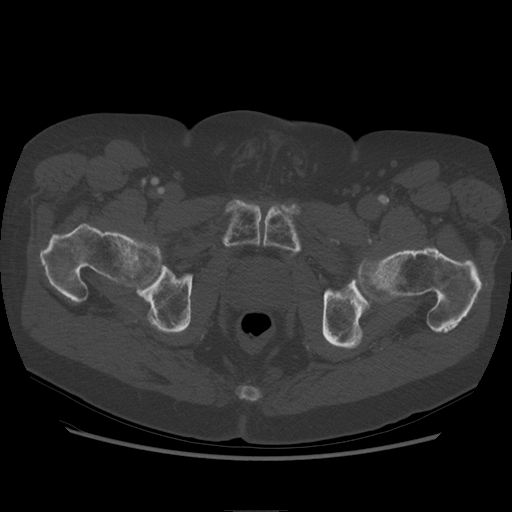
[im 43/256  soft-tissue]
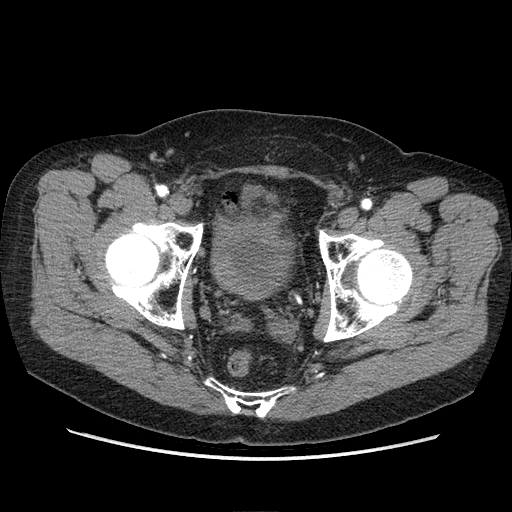
[im 64/256  soft-tissue]
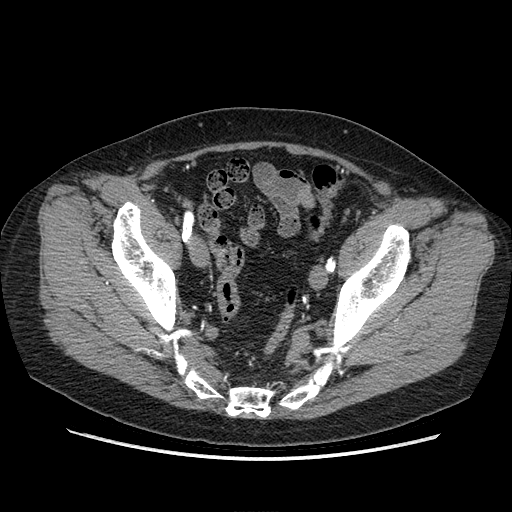
[im 86/256  soft-tissue]
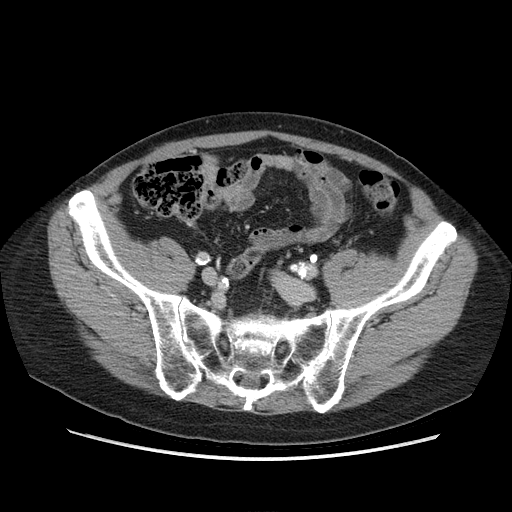
[im 107/256  soft-tissue]
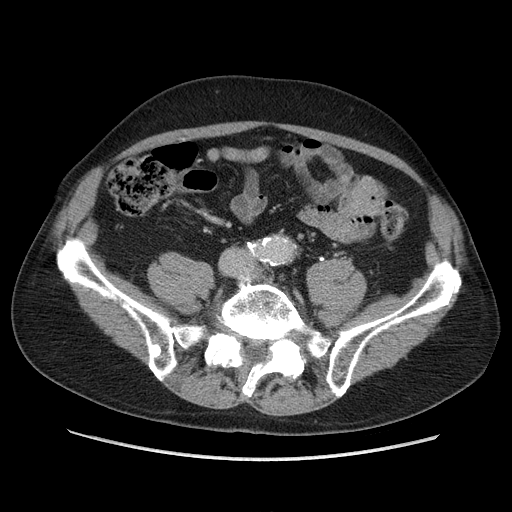
[im 128/256  soft-tissue]
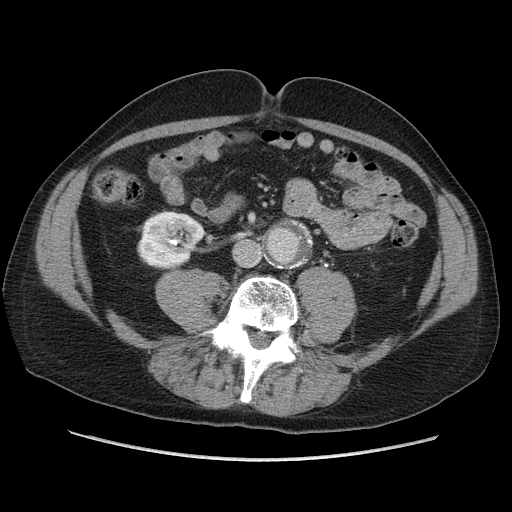
[im 149/256  soft-tissue]
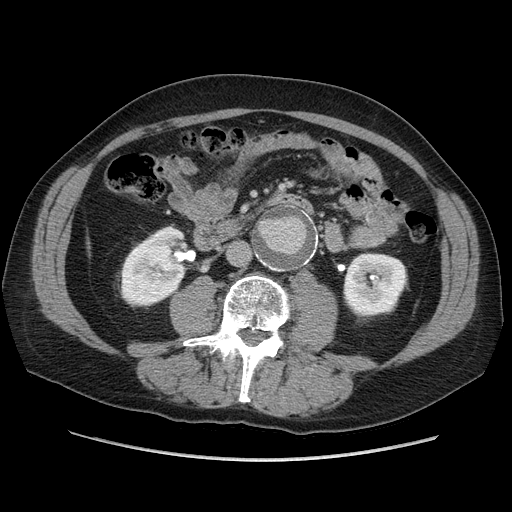
[im 171/256  soft-tissue]
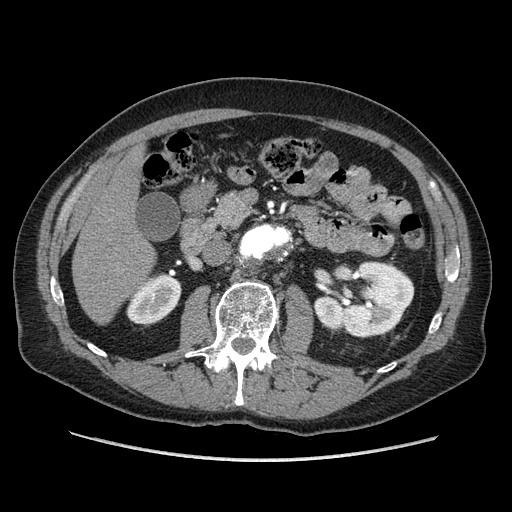
[im 171/256  lung]
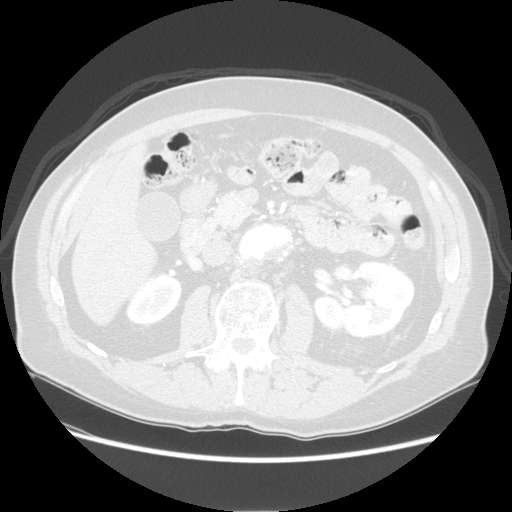
[im 192/256  soft-tissue]
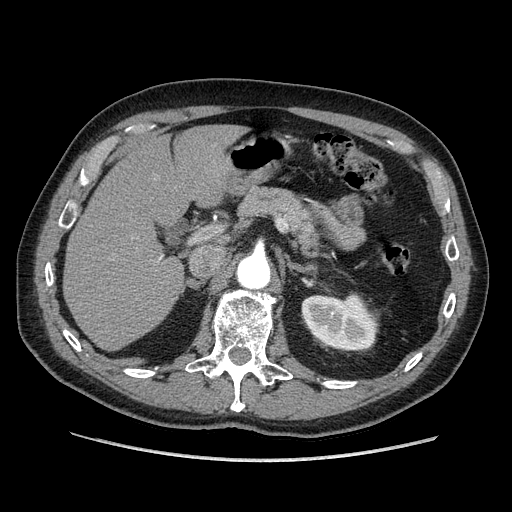
[im 192/256  lung]
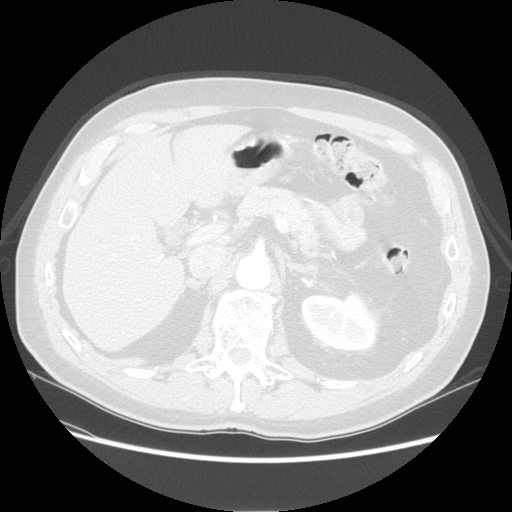
[im 192/256  bone]
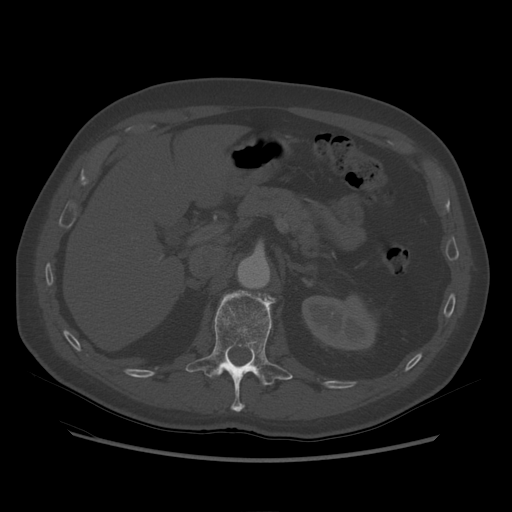
[im 213/256  soft-tissue]
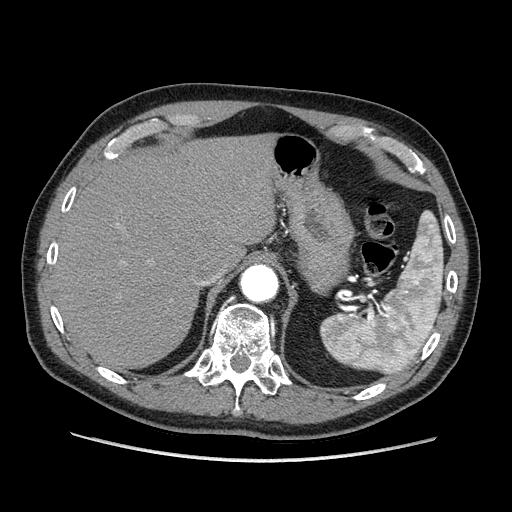
[im 213/256  lung]
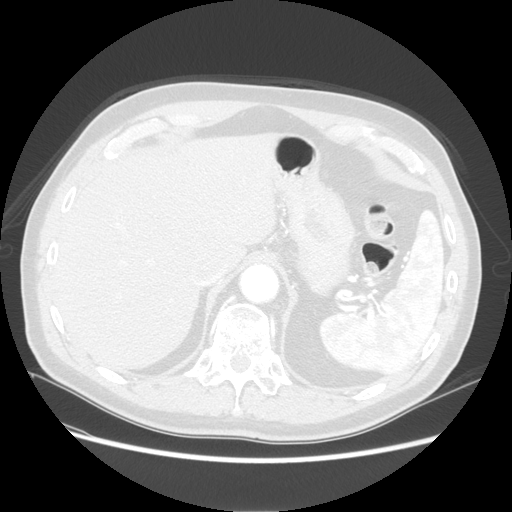
[im 234/256  soft-tissue]
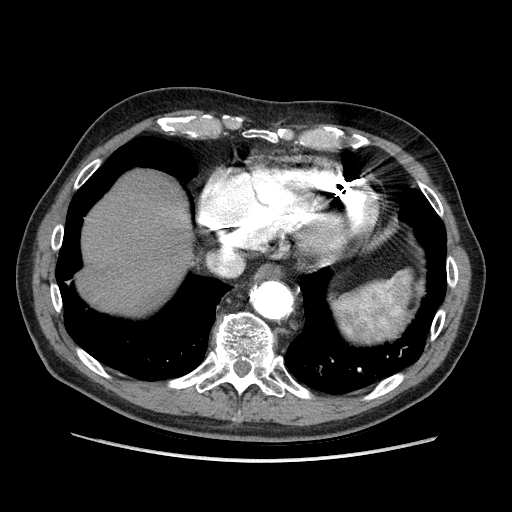
[im 234/256  lung]
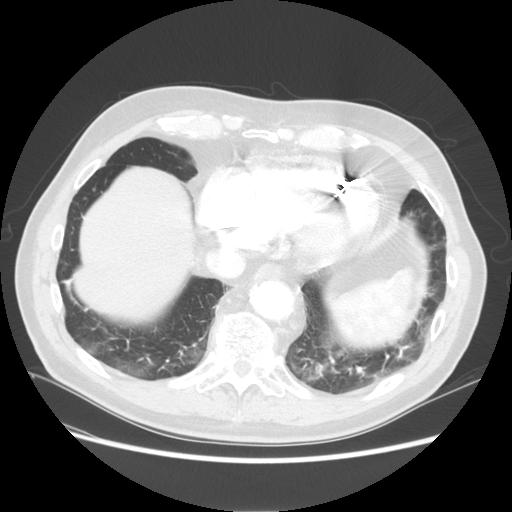

[11 of 46 positions shown; findings below may reference images not displayed]

FINDINGS: Nodular opacities in the medial right anterior lobe and
to a lesser degree in the medial lingula appear stable and most
consistent with scarring. However, compared to the CT from 5885
the nodular opacity in the right middle lobe has increased somewhat
in size, and PET CT may be helpful to assess for metabolic activity
within this region.

 No new parenchymal lesion is seen at the lung bases. Cardiomegaly
is stable.  Several low attenuation hepatic lesions are stable and
consistent with hepatic cysts.  No new hepatic abnormality is seen.
No calcified gallstones are noted.  The pancreas is normal in size
and the pancreatic duct is not dilated.  The adrenal glands and
spleen are unremarkable.  The stomach is decompressed.  The kidneys
enhance with no mass or hydronephrosis noted.  There is some
irregularity of he lower pole of the right kidney which appears
duplicated.  An infrarenal abdominal aortic aneurysm is present
with the following pre stent graft measurements.

Length of infrarenal neck (from lowest renal artery): 19 mm from
the most caudal main renal artery.
Number of renal arteries:  Right = 3, two of which emanate from the
lower aspect of the abdominal aortic aneurysm.; Left = 1
Diameter of infrarenal neck:  23 mm
Total length of aneurysm:  104 mm
Aneurysm ends at aortic bifurcation: No  If no, Distance from
aneurysm to bifurcation: 30 mm
Greatest aneurysm diameter:  52 mmGreatest common iliac artery
diameters:  Right =18 mm; Left = 17 mm
Diameter of common iliac arteries just above iliac bifurcation:
Right = 11 mm; Left = 12 mm
Length of common iliac arteries:  Right = 42 mm; Left = 54 mm.

Significant atheromatous change is noted throughout the common
iliac arteries with no focal aneurysm present.  There is
opacification of the internal and external iliac arteries
bilaterally with diffuse atheromatous change present. On initial
images the urinary bladder is decompressed and very irregular,
worrisome for neoplasm.  On delayed images however, there is
slightly better opacification of portions of the bladder and it
does appear to be thick-walled but the irregularity of the wall is
less impressive.  Is any evidence of hematuria clinically?
Clinical correlation is recommended.  Degenerative disc disease is
present particularly at L4-5 and L5 S1 levels.

 Review of the MIP images confirms the above findings.
IMPRESSION: 1.  Infrarenal abdominal aortic aneurysm within the stent graft
measurements given above.  Maximum diameter of the aneurysm is 52
mm, terminating above the bifurcation.
2.  Somewhat more prominent nodular opacity in the anterior medial
right middle lobe adjacent to the heart border.  Possible scarring
but cannot exclude malignancy.  Consider PET CT to assess for
metabolic activity.
3.  Thick-walled urinary bladder which is not well distended.
Possible degree of bladder outlet obstruction.  Correlate
clinically.

## 2012-08-07 MED ORDER — FUROSEMIDE 10 MG/ML IJ SOLN
20.0000 mg | Freq: Once | INTRAMUSCULAR | Status: AC
  Administered 2012-08-07: 20 mg via INTRAVENOUS

## 2012-08-07 MED ORDER — FUROSEMIDE 10 MG/ML IJ SOLN
INTRAMUSCULAR | Status: AC
  Filled 2012-08-07: qty 4

## 2012-08-07 NOTE — Progress Notes (Addendum)
[facility] Progress Note  08/07/2012 7:18 AM POD 2  Subjective:  Pt states he is having spasms in his abdomen.  Nurse states he is getting Morphine 4mg  every couple of hours.  Tm 99.7 Systolic 100-140s  HR  60-100  Regular (PPM interrogated yesterday) 97%1LO2NC Filed Vitals:   08/07/12 0600  BP: 131/54  Pulse: 66  Temp:   Resp: 16     Physical Exam: Cardiac:  RRR Lungs:  Decreased BS bilaterally Abdomen:  Distended; minimal hypoactive BS; denies flatus Incisions:  C/d/i with ecchymosis the length of the incision. Extremities:  + palpable DP pulses bilaterally.  CBC    Component Value Date/Time   WBC 10.0 08/06/2012 0404   RBC 3.15* 08/06/2012 0404   HGB 10.4* 08/06/2012 0404   HCT 30.7* 08/06/2012 0404   PLT 133* 08/06/2012 0404   MCV 97.5 08/06/2012 0404   MCH 33.0 08/06/2012 0404   MCHC 33.9 08/06/2012 0404   RDW 13.5 08/06/2012 0404   LYMPHSABS 1.4 12/05/2009 1057   MONOABS 0.6 12/05/2009 1057   EOSABS 0.1 12/05/2009 1057   BASOSABS 0.0 12/05/2009 1057    BMET    Component Value Date/Time   NA 137 08/06/2012 0404   K 4.0 08/06/2012 0404   CL 104 08/06/2012 0404   CO2 27 08/06/2012 0404   GLUCOSE 182* 08/06/2012 0404   BUN 20 08/06/2012 0404   CREATININE 1.10 08/06/2012 0404   CALCIUM 7.7* 08/06/2012 0404   GFRNONAA 64* 08/06/2012 0404   GFRAA 74* 08/06/2012 0404    INR    Component Value Date/Time   INR 1.44 08/05/2012 1815     Intake/Output Summary (Last 24 hours) at 08/07/12 0718 Last data filed at 08/07/12 0600  Gross per 24 hour  Intake 2206.67 ml  Output   1395 ml  Net 811.67 ml   NGT Output:  500 cc/24 hrs  Assessment/Plan:  76 y.o. male is s/p Open Abdominal Aortic Aneurysm Repair and Renal Artery Bypass times 2  FEMORAL ARTERY EXPLORATION (N/A)   POD 2  -pt doing well this am with exception of pain issues.  ? PCA-will d/w Dr. Hart [place]. -decreased UOP past 24 hrs and 6L+ for 24 hrs.  Will give gentle diuresis this am. -check labs in am. -mobilize-OOB to  chair tid at meal times. -IS 10x/hr while awake.   Doreatha Massed, PA-C [facility] [phone] 08/07/2012 7:18 AM  Agree with above Will continue IV morphine. If patient does not tolerate this we'll change to PCA Abdomen soft but having abdominal spasms Excellent pulses in feet  Will leave nasogastric tube today and probable to transfer 2000 and a.m.-doing well

## 2012-08-08 LAB — CBC
Hemoglobin: 10.1 g/dL — ABNORMAL LOW (ref 13.0–17.0)
MCH: 33.1 pg (ref 26.0–34.0)
MCHC: 33.4 g/dL (ref 30.0–36.0)
MCV: 99 fL (ref 78.0–100.0)
Platelets: 145 10*3/uL — ABNORMAL LOW (ref 150–400)
RBC: 3.05 MIL/uL — ABNORMAL LOW (ref 4.22–5.81)

## 2012-08-08 LAB — TYPE AND SCREEN

## 2012-08-08 LAB — BASIC METABOLIC PANEL
CO2: 30 mEq/L (ref 19–32)
Calcium: 8.6 mg/dL (ref 8.4–10.5)
Creatinine, Ser: 1 mg/dL (ref 0.50–1.35)
Glucose, Bld: 135 mg/dL — ABNORMAL HIGH (ref 70–99)

## 2012-08-08 MED ORDER — METOPROLOL TARTRATE 1 MG/ML IV SOLN
5.0000 mg | Freq: Four times a day (QID) | INTRAVENOUS | Status: DC
  Administered 2012-08-08 – 2012-08-09 (×5): 5 mg via INTRAVENOUS
  Filled 2012-08-08 (×8): qty 5

## 2012-08-08 NOTE — Progress Notes (Addendum)
[facility] Progress Note  08/08/2012 8:07 AM POD 3  Subjective:  No complaints  HR 60s-110s 130s-180s systolic Tm 99.5 now 98.9 94%RA Filed Vitals:   08/08/12 0804  BP:   Pulse:   Temp: 98.9 F (37.2 C)  Resp:      Physical Exam: Cardiac:  paced Lungs:  Non labored Abdomen:  Soft + flatus -BS Incisions:  C/d/i Extremities:  3+ DP pulses bilaterally  CBC    Component Value Date/Time   WBC 14.0* 08/08/2012 0610   RBC 3.05* 08/08/2012 0610   HGB 10.1* 08/08/2012 0610   HCT 30.2* 08/08/2012 0610   PLT 145* 08/08/2012 0610   MCV 99.0 08/08/2012 0610   MCH 33.1 08/08/2012 0610   MCHC 33.4 08/08/2012 0610   RDW 13.7 08/08/2012 0610   LYMPHSABS 1.4 12/05/2009 1057   MONOABS 0.6 12/05/2009 1057   EOSABS 0.1 12/05/2009 1057   BASOSABS 0.0 12/05/2009 1057    BMET    Component Value Date/Time   NA 139 08/08/2012 0610   K 4.0 08/08/2012 0610   CL 102 08/08/2012 0610   CO2 30 08/08/2012 0610   GLUCOSE 135* 08/08/2012 0610   BUN 22 08/08/2012 0610   CREATININE 1.00 08/08/2012 0610   CALCIUM 8.6 08/08/2012 0610   GFRNONAA 72* 08/08/2012 0610   GFRAA 83* 08/08/2012 0610    INR    Component Value Date/Time   INR 1.44 08/05/2012 1815     Intake/Output Summary (Last 24 hours) at 08/08/12 0807 Last data filed at 08/08/12 0600  Gross per 24 hour  Intake   1990 ml  Output   2180 ml  Net   -190 ml     Assessment/Plan:  76 y.o. male is s/p Open Abdominal Aortic Aneurysm Repair and Renal Artery Bypass times 2  FEMORAL ARTERY EXPLORATION (N/A)   POD 3  -d/c NGT -continue NPO and continue IVF -will start IV lopressor -transfer to 2000 -mobilize -IS 10x/hr  Doreatha Massed, PA-C [facility] [phone] 08/08/2012 8:07 AM  Agree with above  Gen. alert and oriented x3 Abdomen soft no abdominal spasms today 3+ dorsalis pedis pulses palpable Nasogastric drainage minimal  Plan transfer to 2000, ambulate, DC NG tube, strict n.p.o. Doing very well

## 2012-08-08 NOTE — Progress Notes (Signed)
Patient ambulated 50 ft x1 assist with RW. Tolerated well.

## 2012-08-09 LAB — URINALYSIS, ROUTINE W REFLEX MICROSCOPIC
Ketones, ur: 15 mg/dL — AB
Nitrite: NEGATIVE
Protein, ur: 100 mg/dL — AB
Urobilinogen, UA: 1 mg/dL (ref 0.0–1.0)

## 2012-08-09 LAB — CBC
MCH: 33 pg (ref 26.0–34.0)
MCHC: 33.1 g/dL (ref 30.0–36.0)
MCV: 99.7 fL (ref 78.0–100.0)
Platelets: 196 10*3/uL (ref 150–400)
RDW: 13.6 % (ref 11.5–15.5)

## 2012-08-09 LAB — URINE MICROSCOPIC-ADD ON

## 2012-08-09 MED ORDER — LISINOPRIL 20 MG PO TABS
20.0000 mg | ORAL_TABLET | Freq: Every day | ORAL | Status: DC
  Administered 2012-08-09 – 2012-08-11 (×3): 20 mg via ORAL
  Filled 2012-08-09 (×3): qty 1

## 2012-08-09 MED ORDER — TAMSULOSIN HCL 0.4 MG PO CAPS
0.4000 mg | ORAL_CAPSULE | Freq: Every day | ORAL | Status: DC
  Administered 2012-08-09 – 2012-08-10 (×2): 0.4 mg via ORAL
  Filled 2012-08-09 (×3): qty 1

## 2012-08-09 MED ORDER — BISACODYL 10 MG RE SUPP
10.0000 mg | Freq: Once | RECTAL | Status: AC
  Administered 2012-08-09: 10 mg via RECTAL
  Filled 2012-08-09: qty 1

## 2012-08-09 MED ORDER — METOPROLOL SUCCINATE ER 25 MG PO TB24
25.0000 mg | ORAL_TABLET | Freq: Every day | ORAL | Status: DC
  Administered 2012-08-09 – 2012-08-11 (×3): 25 mg via ORAL
  Filled 2012-08-09 (×3): qty 1

## 2012-08-09 NOTE — Progress Notes (Signed)
[facility] Progress Note  08/09/2012 7:23 AM POD 4  Subjective:  Not happy with night shift RN; otherwise no complaints.  Tm 99.2  110s-150s systolic HR 58-120 96%2LO2NC Filed Vitals:   08/09/12 0438  BP: 158/89  Pulse: 121  Temp: 99.2 F (37.3 C)  Resp: 18     Physical Exam: Cardiac:  RRR Lungs:  CTAB Abdomen:  Soft NT/ND less distended; +BS; denies flatus Incisions:  C/d/i (abdominal and right groin) Extremities:  + palpable DP pulses bilaterally  CBC    Component Value Date/Time   WBC 14.0* 08/08/2012 0610   RBC 3.05* 08/08/2012 0610   HGB 10.1* 08/08/2012 0610   HCT 30.2* 08/08/2012 0610   PLT 145* 08/08/2012 0610   MCV 99.0 08/08/2012 0610   MCH 33.1 08/08/2012 0610   MCHC 33.4 08/08/2012 0610   RDW 13.7 08/08/2012 0610   LYMPHSABS 1.4 12/05/2009 1057   MONOABS 0.6 12/05/2009 1057   EOSABS 0.1 12/05/2009 1057   BASOSABS 0.0 12/05/2009 1057    BMET    Component Value Date/Time   NA 139 08/08/2012 0610   K 4.0 08/08/2012 0610   CL 102 08/08/2012 0610   CO2 30 08/08/2012 0610   GLUCOSE 135* 08/08/2012 0610   BUN 22 08/08/2012 0610   CREATININE 1.00 08/08/2012 0610   CALCIUM 8.6 08/08/2012 0610   GFRNONAA 72* 08/08/2012 0610   GFRAA 83* 08/08/2012 0610    INR    Component Value Date/Time   INR 1.44 08/05/2012 1815     Intake/Output Summary (Last 24 hours) at 08/09/12 0723 Last data filed at 08/09/12 0981  Gross per 24 hour  Intake    890 ml  Output   1025 ml  Net   -135 ml     Assessment/Plan:  76 y.o. male is s/p Open Abdominal Aortic Aneurysm Repair and Renal Artery Bypass times 2  FEMORAL ARTERY EXPLORATION (N/A) POD 4  -foley discontinued this am-pt has not voided yet. -sips of clears this am-continue IVF for now -continue to mobilize/IS -dulcolax supp this am. -WBC slightly elevated yesterday with low grade fever this am-check CBC today and in am and check U/A   Doreatha Massed, PA-C [facility] [phone] 08/09/2012 7:23 AM

## 2012-08-09 NOTE — Plan of Care (Signed)
Problem: Phase III Progression Outcomes Goal: Pain controlled on oral analgesia Outcome: Not Progressing Pt is npo and continues to need IV morphine sulphate q 1-2hrs. Will start po pain meds once bowel sounds come back. Goal: Activity at appropriate level-compared to baseline (UP IN CHAIR FOR HEMODIALYSIS) Outcome: Progressing Transfers to the chair with assist of 1-2. Goal: Voiding independently Outcome: Progressing Foley DC'd at 630. Will monitor for adequate void. Goal: IV changed to normal saline lock Outcome: Not Progressing Pt on IV fluids per MD order. Pt dehydrated per labs and MD's notes. Will encourage PO. Will continue to monitor.    Goal: Discharge plan remains appropriate-arrangements made Outcome: Progressing Case manager following for possible home health.

## 2012-08-09 NOTE — Progress Notes (Addendum)
Pt tolerated sips and ice chips and had BM with dulcolax. Will advance diet to clear liquids and re-start his Flomax, toprol, and ACEI today.  CBC    Component Value Date/Time   WBC 12.8* 08/09/2012 0848   RBC 2.97* 08/09/2012 0848   HGB 9.8* 08/09/2012 0848   HCT 29.6* 08/09/2012 0848   PLT 196 08/09/2012 0848   MCV 99.7 08/09/2012 0848   MCH 33.0 08/09/2012 0848   MCHC 33.1 08/09/2012 0848   RDW 13.6 08/09/2012 0848   LYMPHSABS 1.4 12/05/2009 1057   MONOABS 0.6 12/05/2009 1057   EOSABS 0.1 12/05/2009 1057   BASOSABS 0.0 12/05/2009 1057    WBC improved and surgical blood loss anemia is stable.  Doreatha Massed 08/09/2012 10:19 AM   Agree  Durene Cal

## 2012-08-09 NOTE — Progress Notes (Signed)
Occupational Therapy Treatment Patient Details Name: Omar Morales MRN: [id number] DOB: 1936-12-01 Today's Date: 08/09/2012 Time: 1010-1036 OT Time Calculation (min): 26 min  OT Assessment / Plan / Recommendation Comments on Treatment Session no pain reported                   Plan Discharge plan remains appropriate    Precautions / Restrictions Restrictions Weight Bearing Restrictions: No       ADL  Eating/Feeding: Independent;Other (comment) (ice chips) Grooming: Performed;Wash/dry hands Where Assessed - Grooming: Supported standing;Other (comment) (walker) Toilet Transfer: Supervision/safety Toilet Transfer Method: Sit to stand Toileting - Architect and Hygiene: Performed;Supervision/safety Where Assessed - Toileting Clothing Manipulation and Hygiene: Standing ADL Comments: pain much improved!      OT Goals ADL Goals ADL Goal: Toilet Transfer - Progress: Progressing toward goals ADL Goal: Toileting - Clothing Manipulation - Progress: Progressing toward goals  Visit Information  Last OT Received On: 08/09/12          Cognition  Overall Cognitive Status: Appears within functional limits for tasks assessed/performed Arousal/Alertness: Awake/alert Orientation Level: Appears intact for tasks assessed Behavior During Session: [facility] for tasks performed    Mobility  Shoulder Instructions Transfers Transfers: Stand to Sit;Sit to Stand Sit to Stand: 5: Supervision;With upper extremity assist;From chair/3-in-1 Stand to Sit: 5: Supervision;To chair/3-in-1             End of Session OT - End of Session Activity Tolerance: Patient tolerated treatment well Patient left: in chair  GO     Kamarie Veno, [place] Kung 08/09/2012, 10:49 AM

## 2012-08-10 LAB — CBC
HCT: 26.7 % — ABNORMAL LOW (ref 39.0–52.0)
MCV: 97.8 fL (ref 78.0–100.0)
Platelets: 191 10*3/uL (ref 150–400)
RBC: 2.73 MIL/uL — ABNORMAL LOW (ref 4.22–5.81)
WBC: 10.1 10*3/uL (ref 4.0–10.5)

## 2012-08-10 MED ORDER — LEVOFLOXACIN 500 MG PO TABS
500.0000 mg | ORAL_TABLET | Freq: Every day | ORAL | Status: DC
  Administered 2012-08-10 – 2012-08-11 (×2): 500 mg via ORAL
  Filled 2012-08-10 (×3): qty 1

## 2012-08-10 MED ORDER — NONFORMULARY OR COMPOUNDED ITEM: 1.0000 | Freq: Three times a day (TID) | Status: DC

## 2012-08-10 MED ORDER — NONFORMULARY OR COMPOUNDED ITEM
1.0000 | Freq: Two times a day (BID) | Status: DC | PRN
  Administered 2012-08-10: 1 via NASAL
  Filled 2012-08-10 (×2): qty 1

## 2012-08-10 MED ORDER — OXYCODONE HCL 5 MG PO TABS: 5.0000 mg | ORAL_TABLET | Freq: Four times a day (QID) | ORAL | Status: DC | PRN

## 2012-08-10 MED ORDER — PANTOPRAZOLE SODIUM 40 MG PO TBEC
40.0000 mg | DELAYED_RELEASE_TABLET | Freq: Every day | ORAL | Status: DC
  Administered 2012-08-10: 40 mg via ORAL
  Filled 2012-08-10: qty 1

## 2012-08-10 MED ORDER — OVER THE COUNTER MEDICATION: 1.0000 | Freq: Two times a day (BID) | Status: DC | PRN

## 2012-08-10 NOTE — Progress Notes (Signed)
Agree with the above Had bowel movement and passing flatus Abdomen soft Palpable pulses  UA slightly positive, will treat with 3 days of levaquin D/c IVF D/c stool softner D/c in am  Wells Adel Neyer

## 2012-08-10 NOTE — Discharge Summary (Signed)
[facility] Discharge Summary  Omar Morales 10/10/1936 76 y.o. male  [id number]  Admission Date: 08/05/2012  Discharge Date: 08/11/12  Physician: Nada Libman, MD  Admission Diagnosis: Abdominal Aortic Aneurysm   HPI:   This is a 76 y.o. male patient of Dr. [name] who is pending open AAA repair is here today for popliteal artery duplex as well as carotid duplex. The patient has no new complaints, he is pending his aortogram with Dr. [name] Wednesday, July 10. The patient states she is also attending his cardiac clearance from July 15.  Hospital Course:  The patient was admitted to the hospital and taken to the operating room on 08/05/2012 and underwent Open Abdominal Aortic Aneurysm Repair and Renal Artery Bypass times 2  FEMORAL ARTERY EXPLORATION (N/A).  He had to be re-explored before leaving the OR due to poor blood flow to the right leg that required a jump graft to the right femoral artery. The pt tolerated the procedure well and was transported to the PACU in good condition.  By POD 1, he was extubated.  By POD 2, he was having abdominal spasms.  He did have decreased UOP that am and was given gentle diuresis, which he responded well to.  His NGT was left in that day.  On POD 3, he was transferred to the telemetry floor after his NGT was discontinued.  The pt has continued to do well and continues to have palpable pulses in his feet bilaterally.  His diet was gradually increased to solids and he has tolerated this well.  He did have a U/A that was slightly positive and he was placed on 3 days of Levaquin.  The remainder of the hospital course consisted of increasing mobilization and increasing intake of solids without difficulty.  CBC    Component Value Date/Time   WBC 10.1 08/10/2012 0420   RBC 2.73* 08/10/2012 0420   HGB 9.0* 08/10/2012 0420   HCT 26.7* 08/10/2012 0420   PLT 191 08/10/2012 0420   MCV 97.8 08/10/2012 0420   MCH 33.0 08/10/2012 0420   MCHC  33.7 08/10/2012 0420   RDW 13.7 08/10/2012 0420   LYMPHSABS 1.4 12/05/2009 1057   MONOABS 0.6 12/05/2009 1057   EOSABS 0.1 12/05/2009 1057   BASOSABS 0.0 12/05/2009 1057    BMET    Component Value Date/Time   NA 139 08/08/2012 0610   K 4.0 08/08/2012 0610   CL 102 08/08/2012 0610   CO2 30 08/08/2012 0610   GLUCOSE 135* 08/08/2012 0610   BUN 22 08/08/2012 0610   CREATININE 1.00 08/08/2012 0610   CALCIUM 8.6 08/08/2012 0610   GFRNONAA 72* 08/08/2012 0610   GFRAA 83* 08/08/2012 0610     Discharge Instructions:   The patient is discharged to home with extensive instructions on wound care and progressive ambulation.  They are instructed not to drive or perform any heavy lifting until returning to see the physician in his office.  Discharge Orders    Future Appointments: Provider: Department: Dept Phone: Center:   09/20/2012 12:10 PM [facility] [phone] [facility]     Future Orders Please Complete By Expires   Resume previous diet      Driving Restrictions      Comments:   No driving for 2 weeks   Lifting restrictions      Comments:   No lifting for 6 weeks   Call MD for:  temperature >100.5      Call  MD for:  redness, tenderness, or signs of infection (pain, swelling, bleeding, redness, odor or green/yellow discharge around incision site)      Call MD for:  severe or increased pain, loss or decreased feeling  in affected limb(s)      ABDOMINAL PROCEDURE/ANEURYSM REPAIR/AORTO-BIFEMORAL BYPASS:  Call MD for increased abdominal pain; cramping diarrhea; nausea/vomiting      Discharge wound care:      Comments:   Shower daily with soap and water starting 08/11/12. After shower, place dry gauze to right groin to keep area dry.      Discharge Diagnosis:  Abdominal Aortic Aneurysm  Secondary Diagnosis: Patient Active Problem List  Diagnosis  . HYPERTENSION, BENIGN  . AAA  . Sinus node dysfunction  . Hyperlipidemia  . Campath-induced atrial fibrillation  .  Occlusion and stenosis of carotid artery without mention of cerebral infarction   Past Medical History  Diagnosis Date  . Hypertension   . Hypercholesterolemia   . Arrhythmia     cardiac arrythmia  . AAA (abdominal aortic aneurysm)     followed by Dr [name]  . Sleep disorder   . Sinus node dysfunction   . Irregular heart beat     Omar Morales, Omar Morales  Home Medication Instructions [id number]   Printed on:08/11/12 0735  Medication Information                    aspirin 81 MG EC tablet Take 81 mg by mouth daily.             lovastatin (MEVACOR) 40 MG tablet Take 40 mg by mouth at bedtime.            Multiple Vitamins-Minerals (MULTIVITAMIN WITH MINERALS) tablet Take 1 tablet by mouth daily.            Tamsulosin HCl (FLOMAX) 0.4 MG CAPS Take 0.4 mg by mouth at bedtime.            lisinopril (PRINIVIL,ZESTRIL) 20 MG tablet Take 20 mg by mouth daily.            metoprolol succinate (TOPROL-XL) 25 MG 24 hr tablet Take 1 tablet (25 mg total) by mouth daily.           hydrochlorothiazide (HYDRODIURIL) 25 MG tablet Take 12.5 mg by mouth daily.           chlorpheniramine (CHLOR-TRIMETON) 4 MG tablet Take 4 mg by mouth every 6 (six) hours as needed. For allergies           OVER THE COUNTER MEDICATION Place 1 spray into the nose 3 (three) times daily. Capsaicin and Eucalyptus Nasal Spray           phenylephrine (SUDAFED PE) 10 MG TABS Take 10 mg by mouth every 4 (four) hours as needed.           oxyCODONE (OXY IR/ROXICODONE) 5 MG immediate release tablet Take 1 tablet (5 mg total) by mouth every 6 (six) hours as needed. #30 NR          levofloxacin (LEVAQUIN) 500 MG tablet Take 1 tablet (500 mg total) by mouth daily. #2 NR for total of 3 days for slightly positive U/A            Disposition: home  Patient's condition: is Good  Follow up: 1. Dr. [name] in 2 weeks   Doreatha Massed, PA-C [facility] [phone] 08/10/2012  7:33 AM

## 2012-08-10 NOTE — Progress Notes (Signed)
Physical Therapy Treatment Patient Details Name: Omar Morales MRN: [id number] DOB: [birth year] Today's Date: 08/10/2012 Time: 4098-1191 PT Time Calculation (min): 24 min  PT Assessment / Plan / Recommendation Comments on Treatment Session  Pt adm for AAA repair.  Pt making excellent progress and should be able to return home with wife.    Follow Up Recommendations  Home health PT;Supervision - Intermittent    Barriers to Discharge        Equipment Recommendations  Rolling walker with 5" wheels;3 in 1 bedside comode    Recommendations for Other Services    Frequency Min 3X/week   Plan Discharge plan remains appropriate;Frequency remains appropriate    Precautions / Restrictions Precautions Precautions: Fall   Pertinent Vitals/Pain VSS    Mobility  Transfers Sit to Stand: 5: Supervision;With upper extremity assist;With armrests;From chair/3-in-1 Stand to Sit: 5: Supervision;To chair/3-in-1;With upper extremity assist;With armrests Details for Transfer Assistance: Pt self corrected hand placement. Ambulation/Gait Ambulation/Gait Assistance: 5: Supervision;4: Min assist (supervision with RW and min A without assistive deveice) Ambulation Distance (Feet): 350 Feet Assistive device: Rolling walker;None Ambulation/Gait Assistance Details: cues to stand more erect. Gait Pattern: Step-through pattern;Decreased stride length;Trunk flexed Stairs: Yes Stairs Assistance: 5: Supervision Stairs Assistance Details (indicate cue type and reason): 2 Stair Management Technique: Two rails;Alternating pattern;Forwards    Exercises General Exercises - Lower Extremity Hip Flexion/Marching: Strengthening;Both;10 reps;Standing Other Exercises Other Exercises: Standing hamstring curls standing at sink x 10 reps with each leg.   PT Diagnosis:    PT Problem List:   PT Treatment Interventions:     PT Goals Acute Rehab PT Goals Pt will go Sit to Stand: with modified independence PT Goal:  Sit to Stand - Progress: Updated due to goal met Pt will go Stand to Sit: with modified independence PT Goal: Stand to Sit - Progress: Updated due to goals met PT Goal: Ambulate - Progress: Progressing toward goal PT Goal: Up/Down Stairs - Progress: Met  Visit Information  Last PT Received On: 08/10/12 Assistance Needed: +1    Subjective Data  Subjective: Pt states he hopes to go home tomorrow.   Cognition  Overall Cognitive Status: Appears within functional limits for tasks assessed/performed Arousal/Alertness: Awake/alert Orientation Level: Appears intact for tasks assessed Behavior During Session: [facility] for tasks performed    Balance  Static Standing Balance Static Standing - Balance Support: Right upper extremity supported Static Standing - Level of Assistance: 6: Modified independent (Device/Increase time)  End of Session PT - End of Session Activity Tolerance: Patient tolerated treatment well Patient left: with call bell/phone within reach (pt in bathroom with call bell)   GP     [facility] 08/10/2012, 9:33 AM  [facility] PT [phone]

## 2012-08-10 NOTE — Progress Notes (Addendum)
[facility] Progress Note  08/10/2012 7:20 AM POD 5  Subjective:  States he feels antsy this am, but otherwise, no complaints.  130s-140s systolic Afebrile x 24 hrs HR  80-110's  Filed Vitals:   08/10/12 0513  BP: 130/82  Pulse: 84  Temp: 98.2 F (36.8 C)  Resp: 18     Physical Exam: Cardiac:  RRR Lungs:  CTAB Abdomen:  Soft, NT/ND +BS +BM yesterday Incisions:  C/d/i Extremities:  + palpable DP bilaterally.  CBC    Component Value Date/Time   WBC 10.1 08/10/2012 0420   RBC 2.73* 08/10/2012 0420   HGB 9.0* 08/10/2012 0420   HCT 26.7* 08/10/2012 0420   PLT 191 08/10/2012 0420   MCV 97.8 08/10/2012 0420   MCH 33.0 08/10/2012 0420   MCHC 33.7 08/10/2012 0420   RDW 13.7 08/10/2012 0420   LYMPHSABS 1.4 12/05/2009 1057   MONOABS 0.6 12/05/2009 1057   EOSABS 0.1 12/05/2009 1057   BASOSABS 0.0 12/05/2009 1057    BMET    Component Value Date/Time   NA 139 08/08/2012 0610   K 4.0 08/08/2012 0610   CL 102 08/08/2012 0610   CO2 30 08/08/2012 0610   GLUCOSE 135* 08/08/2012 0610   BUN 22 08/08/2012 0610   CREATININE 1.00 08/08/2012 0610   CALCIUM 8.6 08/08/2012 0610   GFRNONAA 72* 08/08/2012 0610   GFRAA 83* 08/08/2012 0610    INR    Component Value Date/Time   INR 1.44 08/05/2012 1815     Intake/Output Summary (Last 24 hours) at 08/10/12 0720 Last data filed at 08/10/12 0600  Gross per 24 hour  Intake   1081 ml  Output      3 ml  Net   1078 ml     Assessment/Plan:  76 y.o. male is s/p Open Abdominal Aortic Aneurysm Repair and Renal Artery Bypass times 2  FEMORAL ARTERY EXPLORATION (N/A) POD 5  -tolerating diet-denies N/V.  Will advance diet to heart healthy. -WBC continues to improve -acute surgical blood loss anemia stable and pt tolerating -if tolerates diet, possibly discharge home tomorrow.  Doreatha Massed, PA-C [facility] [phone] 08/10/2012 7:20 AM

## 2012-08-10 NOTE — Progress Notes (Signed)
Agree with above Stool with dulcolox.  Some flatus Tolerated clears Abdomen soft and palpable pulses Adv diet in am and poss d/c  Wells Delmore Sear

## 2012-08-11 MED ORDER — LEVOFLOXACIN 500 MG PO TABS: 500.0000 mg | ORAL_TABLET | Freq: Every day | ORAL | Status: DC

## 2012-08-11 MED ORDER — OXYCODONE HCL 5 MG PO TABS: 5.0000 mg | ORAL_TABLET | Freq: Four times a day (QID) | ORAL | Status: AC | PRN

## 2012-08-11 MED ORDER — LEVOFLOXACIN 500 MG PO TABS: 500.0000 mg | ORAL_TABLET | Freq: Every day | ORAL | Status: AC

## 2012-08-11 NOTE — Progress Notes (Signed)
Agree with above Ready for discharge  Omar Morales

## 2012-08-11 NOTE — Discharge Summary (Signed)
Agree with above Ready for discharge.  Will go home with 2 days of ABx for ? UTI  Durene Cal

## 2012-08-11 NOTE — Progress Notes (Signed)
Occupational Therapy Treatment Patient Details Name: Omar Morales MRN: [id number] DOB: May 18, 1936 Today's Date: 08/11/2012 Time: 6295-2841 OT Time Calculation (min): 13 min  OT Assessment / Plan / Recommendation Comments on Treatment Session Pt making great progress. HHOT no longer recommended    Follow Up Recommendations  No OT follow up    Barriers to Discharge       Equipment Recommendations  None recommended by OT    Recommendations for Other Services    Frequency Min 2X/week   Plan Discharge plan needs to be updated    Precautions / Restrictions Precautions Precautions: Fall Restrictions Weight Bearing Restrictions: No   Pertinent Vitals/Pain Pt denies any pain at this time    ADL  Grooming: Performed;Wash/dry hands;Independent Where Assessed - Grooming: Unsupported standing Upper Body Bathing: Simulated;Supervision/safety Where Assessed - Upper Body Bathing: Unsupported sit to stand Lower Body Bathing: Simulated;Supervision/safety Where Assessed - Lower Body Bathing: Unsupported standing Lower Body Dressing: Performed;Independent Where Assessed - Lower Body Dressing: Unsupported sit to stand Toilet Transfer: Performed;Independent Toilet Transfer Method: Sit to Barista: Regular height toilet Toileting - Clothing Manipulation and Hygiene: Independent;Performed Where Assessed - Engineer, mining and Hygiene: Sit to stand from 3-in-1 or toilet Tub/Shower Transfer: Performed;Modified independent (hold to shower frame for safety) Tub/Shower Transfer Method: Science writer: Walk in shower Equipment Used: Gait belt Transfers/Ambulation Related to ADLs: Independent without AD ADL Comments: no c/o pain    OT Diagnosis:    OT Problem List:   OT Treatment Interventions:     OT Goals ADL Goals ADL Goal: Grooming - Progress: Met ADL Goal: Upper Body Bathing - Progress: Met ADL Goal: Lower Body  Bathing - Progress: Met ADL Goal: Upper Body Dressing - Progress: Met ADL Goal: Lower Body Dressing - Progress: Met ADL Goal: Toilet Transfer - Progress: Met ADL Goal: Toileting - Clothing Manipulation - Progress: Met ADL Goal: Toileting - Hygiene - Progress: Met ADL Goal: Tub/Shower Transfer - Progress: Met Miscellaneous OT Goals OT Goal: Miscellaneous Goal #1 - Progress: Progressing toward goals OT Goal: Miscellaneous Goal #2 - Progress: Discontinued (comment)  Visit Information  Last OT Received On: 08/11/12 Assistance Needed: +1    Subjective Data      Prior Functioning       Cognition  Overall Cognitive Status: Appears within functional limits for tasks assessed/performed Arousal/Alertness: Awake/alert Orientation Level: Appears intact for tasks assessed Behavior During Session: [facility] for tasks performed    Mobility  Shoulder Instructions Transfers Transfers: Stand to Sit;Sit to Stand Sit to Stand: 7: Independent;From chair/3-in-1;From toilet;Without upper extremity assist Stand to Sit: 7: Independent;To toilet;To chair/3-in-1;Without upper extremity assist Details for Transfer Assistance: good hand placement, also able to perform without UE assist       Exercises      Balance     End of Session OT - End of Session Equipment Utilized During Treatment: Gait belt Activity Tolerance: Patient tolerated treatment well Patient left: in chair Nurse Communication: Mobility status  GO     Gaines Cartmell 08/11/2012, 10:54 AM

## 2012-08-11 NOTE — Progress Notes (Signed)
Patient transported via wheelchair to main lobby for d/c home per volunteer staff. Omar Morales

## 2012-08-11 NOTE — Patient Care Conference (Signed)
D/c instructions along with medlist and scripts provided. IVs d/c'd with catheter intact. Omar Morales

## 2012-08-11 NOTE — Progress Notes (Addendum)
[facility] Progress Note  08/11/2012 7:16 AM POD 6  Subjective:  "I'm ready to go home"  Afebrile x 24 hrs HR 60s-90s 140s-150s systolic 96%RA Filed Vitals:   08/11/12 0528  BP: 152/78  Pulse: 73  Temp: 98 F (36.7 C)  Resp: 16     Physical Exam: Cardiac:  RRR Lungs:  CTAB Abdomen:  Soft, NT/ND +BS Incisions:  C/d/i on abdomen and right groin Extremities:  + palpable pulses DP bilaterally  CBC    Component Value Date/Time   WBC 10.1 08/10/2012 0420   RBC 2.73* 08/10/2012 0420   HGB 9.0* 08/10/2012 0420   HCT 26.7* 08/10/2012 0420   PLT 191 08/10/2012 0420   MCV 97.8 08/10/2012 0420   MCH 33.0 08/10/2012 0420   MCHC 33.7 08/10/2012 0420   RDW 13.7 08/10/2012 0420   LYMPHSABS 1.4 12/05/2009 1057   MONOABS 0.6 12/05/2009 1057   EOSABS 0.1 12/05/2009 1057   BASOSABS 0.0 12/05/2009 1057    BMET    Component Value Date/Time   NA 139 08/08/2012 0610   K 4.0 08/08/2012 0610   CL 102 08/08/2012 0610   CO2 30 08/08/2012 0610   GLUCOSE 135* 08/08/2012 0610   BUN 22 08/08/2012 0610   CREATININE 1.00 08/08/2012 0610   CALCIUM 8.6 08/08/2012 0610   GFRNONAA 72* 08/08/2012 0610   GFRAA 83* 08/08/2012 0610    INR    Component Value Date/Time   INR 1.44 08/05/2012 1815     Intake/Output Summary (Last 24 hours) at 08/11/12 0716 Last data filed at 08/10/12 1300  Gross per 24 hour  Intake    600 ml  Output      0 ml  Net    600 ml     Assessment/Plan:  76 y.o. male is s/p Open Abdominal Aortic Aneurysm Repair and Renal Artery Bypass times 2  FEMORAL ARTERY EXPLORATION (N/A) POD 6  -pt doing well this am -tolerating diet -d/c home -f/u with Dr. [name] in 2 weeks.  Doreatha Massed, PA-C [facility] [phone] 08/11/2012 7:16 AM

## 2012-08-12 ENCOUNTER — Telehealth: Payer: Self-pay | Admitting: Surgery

## 2012-08-12 NOTE — Op Note (Signed)
[facility] [place]  Patient name: Omar Morales MRN: [id number] DOB: October 10, 1936 Sex: male  08/05/2012 Pre-operative Diagnosis: Ischemic right leg Post-operative diagnosis:  Same Surgeon:  [name] Assistants:  Doreatha Massed, P.A. Procedure:    #1 reopening of recent laparotomy    #2 right aorta femoral bypass  Anesthesia:   general  Blood Loss:  See anesthesia record Specimens:  None   Findings:  Following closure of the abdomen and removing the drapes I was not satisfied with the Doppler signals in the right leg. Because of the significant plaque within the iliac artery on the right I felt that I needed to reevaluate this. I found that there was an occlusion of the right limb at the level of the anastomosis. I was not comfortable going to the extrailiac artery as it was also heavily calcified. I therefore jumped down to the common femoral artery with 8 mm dacryon graft.   Indications:   patient had poor blood flow to his right leg following his aortobiiliac aneurysm repair and therefore he was re prepped and draped for further evaluation   Procedure:  The patient was identified in the holding area and taken to [facility] OR ROOM 16  The patient was then placed supine on the table. general anesthesia was administered.  The patient was prepped and draped in the usual sterile fashion.  A time out was called and antibiotics were administered.   the patient's abdomen was reopened. The Omni-Tract retractor was placed. I exposed the aortoiliac graft. I could not feel a pulse in the right limb. I therefore re\re heparinized the patient. I placed a clamp on the left and right iliac vessels. I transected the right limb of the dacryon graft just above the anastomosis. There was acute thrombus as well as the dissected artery at this level. I decided to oversew the right iliac artery using the cuff I had left of the dacryon graft. This was done with 5-0 Prolene in 2 layers. I  felt the best option was to jumped down to the right femoral artery as the iliac vessel was calcified throughout. I made an oblique incision in the right groin. The femoral vessels were exposed this incision. I individually isolated the common femoral, profunda femoral, and superficial femoral artery. Crossing iliac vein was ligated between 2-0 silk ties. I then created a tunnel directly anterior to the iliac artery and posterior to the ureter. An umbilical tape was passed. I then selected a 8 mm dacryon graft. This was sewn end to in to the previous graft with a 5-0 Prolene. The graft was then brought through the previously created tunnel. At this point I occluded the common femoral, superficial femoral, and profunda femoral arteries. I made a arteriotomy with an #11 blade in the mid to distal common femoral artery. It was very soft at this level. The arteriotomy was opened longitudinally with Potts scissors. The graft was then to the appropriate length and then transected, beveling it to fit the size of the arteriotomy. A running anastomosis was created with 5-0 Prolene. Prior to completion the appropriate flushing maneuvers were performed. The anastomosis was completed, and blood flow was reestablished to the right leg. There were excellent signals to the right foot at this time. I then administered protamine. Once hemostasis was acceptable the groin was closed by reapproximating the femoral sheath with 2-0 Vicryl. The subcutaneous tissue was closed in multiple layers of 3-0 Vicryl and the skin was closed with  4-0 Vicryl. The abdomen was then irrigated. I closed the retroperitoneal tissue with running 2-0 Vicryl. The small bowel was placed back into its anatomic position making sure there were no kinks within the mesentery. The colon was also reflected to its anatomic position. The fascia was closed with 2 running #1 PDS suture. Subcutaneous tissue was closed with 2 layers of 3-0 Vicryl the skin was closed with  4-0 Vicryl. Dermabond placed on the wound. The patient was left intubated. He was taken to the intensive care unit in stable condition.    Disposition:  To PACU in stable condition.   Juleen China, M.D. [facility] [place] Office: [phone] Pager:  [phone]

## 2012-08-12 NOTE — Telephone Encounter (Addendum)
Message copied by Shari Prows on Thu Aug 12, 2012 11:52 AM ------      Message from: Lorin Mercy K      Created: Thu Aug 12, 2012  7:42 AM      Regarding: schedule                   ----- Message -----         From: Dara Lords, PA         Sent: 08/11/2012   7:41 AM           To: Sharee Pimple, CMA            S/p aortobi-iliac BPG.            F/u in 2 weeks with Dr. [name].            Thanks,      Lelon Mast I scheduled an appt for pt on 08/23/12 at 3:30pm with [facility]. Pt's wife is aware. awt

## 2012-08-12 NOTE — Op Note (Signed)
[facility] [place]  Patient name: Omar Morales MRN: [id number] DOB: [birth year] Sex: male  08/05/2012 Pre-operative Diagnosis: Abdominal aortic aneurysm Post-operative diagnosis:  Same Surgeon:  [name] Assistants:  Doreatha Massed Procedure:   #1 Open repair of infrarenal abdominal aortic aneurysm using a aortobiiliac graft (16 x 8 dacryon)   #2 Right renal artery revascularization via a Carrell patch incorporating 2 accessory renal arteries on the     right Anesthesia:  Gen. Blood Loss:  See anesthesia record Specimens:  Aortic thrombus  Findings:  A. and 2 and proximal anastomosis was constructed. I also did a end to end anastomosis to both common iliac arteries. There was extensive calcification within the iliac vessels through out extending into the external iliac arteries. Diarrhea and planted to renal arteries which originated near the aortic bifurcation. Preoperative angiography revealed that they supplied approximately 50% of the right kidney. The 2 renal arteries were sewn as a island patch using a cuff of the a aorta to the dacryon graft just above the bifurcation on the right.  Indications:  The patient has been followed with serial imaging studies for his abdominal aortic aneurysm. He is not a candidate for endovascular repair given that he has 2 renal arteries on the right that originate at the aortic bifurcation and supplied 50% of the right kidney as documented by angiography. He comes in today for open repair  Procedure:  The patient was identified in the holding area and taken to [facility] OR ROOM 16  The patient was then placed supine on the table. general anesthesia was administered.  The patient was prepped and draped in the usual sterile fashion.  A time out was called and antibiotics were administered.  A midline abdominal incision was made from the xiphoid to below the umbilicus. The tear 2 medial lining was opened sharply and then extended  throughout the length of the incision. The abdomen was inspected. There was no gross pathology. The transverse colon was reflected cephalad and the small bowel was mobilized to the patient's right. The ligament of Treitz was taken down sharply. A Omni track retractor was used to assist with exposure. I then opened the retroperitoneum and exposed the aorta. There was approximately 15-20 mm of normal aortic neck below the renal arteries. The aorta was circumferentially exposed here. I did not see a developed inferior mesenteric artery. I identified the 2 accessory renal artery branches on the right side of the patient just above the bifurcation. They were individually dissected free. I then dissected out the bilateral common iliac arteries. Both iliac arteries were heavily calcified. The right was more severe than the left. I felt that the external iliac artery was also calcified and therefore it would be appropriate to do the anastomosis to the common iliac artery on the right. Once I was satisfied with the exposure, the patient was given systemic heparinization. I also gave 25 mg of mannitol. After the heparin had circulated the iliac vessels were occluded with Henley clamps and the aorta was occluded below the renal artery with a Harken clamp. A #11 blade was used to make an arteriotomy which was extended with curved Mayo scissors. Patent lumbar backbleeding vessels were oversewn with a figure-of-eight 2-0 silk suture. I identified the renal arteries on the inside and preserved them. There was still backbleeding from the iliac vessels and so additional clamps were required for hemostasis. I selected a 16 x 8 bifurcated dacryon graft. I transected the aortic  neck and did a hand to and proximal anastomosis. A felt strip was incorporated. 3 horizontal mattress 3-0 Prolene sutures were placed in the back wall, and the rest of the anastomosis was created with running 3-0 Prolene. The anastomosis was tested and found to  be hemostatic. I then proceeded with the left iliac anastomosis. The graft was cut to the appropriate length. The iliac artery was transected and a hand to end anastomosis was created with running 5-0 Prolene. Prior to completion the appropriate flushing maneuvers were performed. The anastomosis was then completed and blood flow reestablished to the left leg. Next, attention was turned towards the right common iliac artery this was much more calcified than the left. It did however feel that I could sew to this area the biggest concern was the posterior plaque which I felt could be negotiated. The right limb of the graft was cut to the appropriate length. A end to end anastomosis was performed with running 5-0 Prolene. I could not get the needle go through the posterior plaque and so I took several adventitial bites to complete the anastomosis. Prior completion the appropriate flushing maneuvers were performed. The anastomosis was in. Blood flow was reestablished to the right leg. The patient had palpable femoral pulses at this point. An the wound was then irrigated. Protamine was administered. Once I was satisfied with hemostasis the aneurysm sac was closed over the aortic graft with 2-0 Vicryl. The retroperitoneal tissue was then reapproximated with a 2-0 Vicryl in a running fashion. Next the small bowel was placed back into an anatomic position. The bowel was run throughout its length. There were no defects. I then inspected the colon. It was pink and viable. A nature that the small bowel mesentery was splayed out without kinking. Next the fascia was closed with 2 running #1 PDS suture. Subcutaneous tissue was closed with a 2-0 Vicryl and the skin was closed with 4-0 Vicryl. Dermabond placed on the wound.  After removing the drapes I was not satisfied with pulses in the right leg and therefore elected to reopen. Please see additional op note for that procedure.   Disposition:  To PACU in stable  condition.   Juleen China, M.D. [facility] [place] Office: [phone] Pager:  [phone]

## 2012-08-20 ENCOUNTER — Encounter: Payer: Self-pay | Admitting: Surgery

## 2012-08-23 ENCOUNTER — Ambulatory Visit (INDEPENDENT_AMBULATORY_CARE_PROVIDER_SITE_OTHER): Payer: Medicare HMO | Admitting: Surgery

## 2012-08-23 ENCOUNTER — Encounter: Payer: Self-pay | Admitting: Surgery

## 2012-08-23 VITALS — BP 126/54 | HR 60 | Temp 98.2°F | Resp 16 | Ht 70.0 in | Wt 183.1 lb

## 2012-08-23 DIAGNOSIS — I714 Abdominal aortic aneurysm, without rupture: Secondary | ICD-10-CM

## 2012-08-23 NOTE — Progress Notes (Signed)
The patient is back today for his first postoperative visit. On 08/12/2012 he underwent open repair of an abdominal aortic aneurysm using a bifurcated graft to the bilateral common iliac arteries. I also reimplanted 2 renal arteries which originated at the aortic bifurcation. This was done as an [place] patch the tube portion of the bifurcated graft on the right side. Upon completion of the procedure I did not palpate pulses as I had preoperatively on the right leg. I was concerned were limb occlusion on the right given the amount of calcium in the right common iliac artery at the level of the anastomosis. Therefore the abdomen was reopened and explored. The right limb had occluded. With this, I decided to jump to the common femoral artery as I did not appreciate a good distal target in the external iliac artery. The patient actually did very well postoperatively and was discharged to home on postoperative day 5. He is back today for followup. He has no complaints at this time. His appetite is returning. He is having normal bowel movements. He is taking minimal narcotics for pain.  Physical exam: His midline incision is well-healed without evidence of hernia. The right groin incision is well-healed. He has palpable pedal pulses.  Overall the patient is doing very well. I have given him an additional 30 oxycodone. He will come back for repeat evaluation in 6 months.

## 2012-09-02 ENCOUNTER — Other Ambulatory Visit: Payer: Self-pay | Admitting: *Deleted

## 2012-09-02 DIAGNOSIS — I739 Peripheral vascular disease, unspecified: Secondary | ICD-10-CM

## 2012-09-02 DIAGNOSIS — Z48812 Encounter for surgical aftercare following surgery on the circulatory system: Secondary | ICD-10-CM

## 2012-09-20 ENCOUNTER — Ambulatory Visit (INDEPENDENT_AMBULATORY_CARE_PROVIDER_SITE_OTHER): Payer: Medicare HMO | Admitting: *Deleted

## 2012-09-20 ENCOUNTER — Encounter: Payer: Self-pay | Admitting: Internal Medicine

## 2012-09-20 DIAGNOSIS — I495 Sick sinus syndrome: Secondary | ICD-10-CM

## 2012-09-20 DIAGNOSIS — I4891 Unspecified atrial fibrillation: Secondary | ICD-10-CM

## 2012-09-24 LAB — REMOTE PACEMAKER DEVICE
AL IMPEDENCE PM: 654 Ohm
ATRIAL PACING PM: 46
RV LEAD THRESHOLD: 1 V

## 2012-09-27 ENCOUNTER — Encounter: Payer: Self-pay | Admitting: *Deleted

## 2012-12-27 ENCOUNTER — Encounter: Payer: Self-pay | Admitting: Internal Medicine

## 2012-12-27 ENCOUNTER — Ambulatory Visit (INDEPENDENT_AMBULATORY_CARE_PROVIDER_SITE_OTHER): Payer: Medicare HMO | Admitting: *Deleted

## 2012-12-27 DIAGNOSIS — Z950 Presence of cardiac pacemaker: Secondary | ICD-10-CM

## 2012-12-27 DIAGNOSIS — I495 Sick sinus syndrome: Secondary | ICD-10-CM

## 2012-12-29 LAB — REMOTE PACEMAKER DEVICE
AL IMPEDENCE PM: 621 Ohm
AL THRESHOLD: 0.5 V
RV LEAD IMPEDENCE PM: 499 Ohm
RV LEAD THRESHOLD: 1.125 V

## 2013-01-18 ENCOUNTER — Encounter: Payer: Self-pay | Admitting: *Deleted

## 2013-02-25 ENCOUNTER — Encounter: Payer: Self-pay | Admitting: Surgery

## 2013-02-28 ENCOUNTER — Encounter: Payer: Self-pay | Admitting: Surgery

## 2013-02-28 ENCOUNTER — Ambulatory Visit (INDEPENDENT_AMBULATORY_CARE_PROVIDER_SITE_OTHER): Payer: Medicare HMO | Admitting: Surgery

## 2013-02-28 ENCOUNTER — Encounter (INDEPENDENT_AMBULATORY_CARE_PROVIDER_SITE_OTHER): Payer: Medicare HMO | Admitting: Vascular Surgery

## 2013-02-28 VITALS — BP 115/77 | HR 63 | Resp 16 | Ht 70.0 in | Wt 183.0 lb

## 2013-02-28 DIAGNOSIS — I714 Abdominal aortic aneurysm, without rupture: Secondary | ICD-10-CM

## 2013-02-28 DIAGNOSIS — Z48812 Encounter for surgical aftercare following surgery on the circulatory system: Secondary | ICD-10-CM

## 2013-02-28 DIAGNOSIS — I739 Peripheral vascular disease, unspecified: Secondary | ICD-10-CM

## 2013-02-28 NOTE — Progress Notes (Signed)
[facility] [place]   Patient name: Omar Morales MRN: [id number] DOB: 26-Dec-1935 Sex: male     Chief Complaint  Patient presents with  . AAA    S/P  AAA repair 08/05/12  with Vascular Lab study.    HISTORY OF PRESENT ILLNESS: On 08/12/2012 he underwent open repair of an abdominal aortic aneurysm using a bifurcated graft to the bilateral common iliac arteries. I also reimplanted 2 renal arteries which originated at the aortic bifurcation. This was done as an [place] patch the tube portion of the bifurcated graft on the right side. Upon completion of the procedure I did not palpate pulses as I had preoperatively on the right leg. I was concerned were limb occlusion on the right given the amount of calcium in the right common iliac artery at the level of the anastomosis. Therefore the abdomen was reopened and explored. The right limb had occluded. With this, I decided to jump to the common femoral artery as I did not appreciate a good distal target in the external iliac artery. The patient actually did very well postoperatively and was discharged to home on postoperative day 5. He is back today for followup. He has no specific complaints today. He is gaining weight.   Past Medical History  Diagnosis Date  . Hypertension   . Hypercholesterolemia   . Arrhythmia     cardiac arrythmia  . AAA (abdominal aortic aneurysm)     followed by Dr [name]  . Sleep disorder   . Sinus node dysfunction   . Irregular heart beat     Past Surgical History  Procedure Laterality Date  . Tonsillectomy    . Tibia fracture surgery      repair  . Pacemaker insertion  12/10/09    MDT Adaptal L implanted by Dr [name]  . Aortagram  06/09/12    Abdominal Aortagram  . Fracture surgery  1942    Broken leg Right   . Appendectomy  1947  . Abdominal aortic aneurysm repair  08/05/2012    Procedure: ANEURYSM ABDOMINAL AORTIC REPAIR;  Surgeon: Nada Libman, MD;  Location: [facility] OR;  Service:  Vascular;  Laterality: N/A;  Open Abdominal Aortic  Aneurysm Repair and Renal Artery Bypass times 2  . Femoral artery exploration  08/05/2012    Procedure: FEMORAL ARTERY EXPLORATION;  Surgeon: Nada Libman, MD;  Location: [facility] OR;  Service: Vascular;  Laterality: N/A;  . Abdominal aortic aneurysm repair  08/05/12    History   Social History  . Marital Status: Married    Spouse Name: N/A    Number of Children: N/A  . Years of Education: N/A   Occupational History  . Not on file.   Social History Main Topics  . Smoking status: Former Smoker -- 1.00 packs/day for 50 years    Types: Cigarettes    Quit date: 08/01/2009  . Smokeless tobacco: Never Used  . Alcohol Use: No  . Drug Use: No  . Sexually Active: Not on file   Other Topics Concern  . Not on file   Social History Narrative  . No narrative on file    Family History  Problem Relation Age of Onset  . Heart disease Father     Heart Disease before age 78  . Heart attack Father   . Hyperlipidemia Father   . Hypertension Father   . Hyperlipidemia Mother   . Hypertension Mother     Allergies as of 02/28/2013  . (  No Known Allergies)    Current Outpatient Prescriptions on File Prior to Visit  Medication Sig Dispense Refill  . aspirin 81 MG EC tablet Take 81 mg by mouth daily.        . hydrochlorothiazide (HYDRODIURIL) 25 MG tablet Take 12.5 mg by mouth daily.      Marland Kitchen lisinopril (PRINIVIL,ZESTRIL) 20 MG tablet Take 20 mg by mouth daily.       Marland Kitchen lovastatin (MEVACOR) 40 MG tablet Take 40 mg by mouth at bedtime.       . metoprolol succinate (TOPROL-XL) 25 MG 24 hr tablet Take 1 tablet (25 mg total) by mouth daily.  60 tablet  6  . Multiple Vitamins-Minerals (MULTIVITAMIN WITH MINERALS) tablet Take 1 tablet by mouth daily.       . Tamsulosin HCl (FLOMAX) 0.4 MG CAPS Take 0.4 mg by mouth at bedtime.       . chlorpheniramine (CHLOR-TRIMETON) 4 MG tablet Take 4 mg by mouth every 6 (six) hours as needed. For allergies      .  OVER THE COUNTER MEDICATION Place 1 spray into the nose 3 (three) times daily. Capsaicin and Eucalyptus Nasal Spray      . oxyCODONE (OXY IR/ROXICODONE) 5 MG immediate release tablet as needed.      . phenylephrine (SUDAFED PE) 10 MG TABS Take 10 mg by mouth every 4 (four) hours as needed.      Marland Kitchen RAPAFLO 4 MG CAPS capsule At bedtime as needed.       No current facility-administered medications on file prior to visit.     REVIEW OF SYSTEMS: Please see history of present illness, otherwise all systems negative  PHYSICAL EXAMINATION:   Vital signs are BP 115/77  Pulse 63  Resp 16  Ht 5\' 10"  (1.778 m)  Wt 183 lb (83.008 kg)  BMI 26.26 kg/m2  SpO2 96% General: The patient appears their stated age. HEENT:  No gross abnormalities Pulmonary:  Non labored breathing Abdomen: Soft and non-tender. Incision well healed without hernia as is the right inguinal incision  Musculoskeletal: There are no major deformities. Neurologic: No focal weakness or paresthesias are detected, Skin: There are no ulcer or rashes noted. Psychiatric: The patient has normal affect. Cardiovascular: There is a regular rate and rhythm without significant murmur appreciated.   Diagnostic Studies Ultrasound was ordered and reviewed today. The ABI on the right is 1.0 with triphasic waveforms. On the left is 1.0 with triphasic waveforms  Assessment: Status post open aneurysm repair with reimplantation of accessory right renal arteries and conversion to a right aortofemoral graft  Plan: The patient has done very well after his operation. I will plan on having him back in 2 years for a repeat evaluation of the right limb into the right femoral artery. In addition I would like to look at the right renal artery that was reimplanted, as well as looking for aneurysmal degeneration at the proximal and distal anastomoses.  [name], M.D. [facility] [place] Office: [phone] Pager:   [phone]

## 2013-02-28 NOTE — Addendum Note (Signed)
Addended by: Adria Dill L on: 02/28/2013 04:40 PM   Modules accepted: Orders

## 2013-03-28 ENCOUNTER — Encounter: Payer: Self-pay | Admitting: Internal Medicine

## 2013-03-28 ENCOUNTER — Ambulatory Visit (INDEPENDENT_AMBULATORY_CARE_PROVIDER_SITE_OTHER): Payer: Medicare HMO | Admitting: Internal Medicine

## 2013-03-28 VITALS — BP 121/77 | HR 66 | Ht 70.0 in | Wt 197.0 lb

## 2013-03-28 DIAGNOSIS — I495 Sick sinus syndrome: Secondary | ICD-10-CM

## 2013-03-28 DIAGNOSIS — I4891 Unspecified atrial fibrillation: Secondary | ICD-10-CM

## 2013-03-28 DIAGNOSIS — I10 Essential (primary) hypertension: Secondary | ICD-10-CM

## 2013-03-28 LAB — PACEMAKER DEVICE OBSERVATION
AL IMPEDENCE PM: 611 Ohm
ATRIAL PACING PM: 62
BATTERY VOLTAGE: 2.79 V
RV LEAD IMPEDENCE PM: 531 Ohm
VENTRICULAR PACING PM: 54

## 2013-03-28 MED ORDER — APIXABAN 5 MG PO TABS: 5.0000 mg | ORAL_TABLET | Freq: Two times a day (BID) | ORAL | Status: DC

## 2013-03-28 NOTE — Patient Instructions (Signed)
   Stop Aspirin  Begin Eliquis 5mg  twice a day  Continue all other current medications.    Labs today for BMET, CBC  Office will contact with results  Keep already scheduled appointment with Dr. Antoine Poche for June  [facility] - 06/27/2013  [facility] - 1 year

## 2013-03-28 NOTE — Progress Notes (Signed)
PCP: Ignatius Specking., MD Primary Cardiologist:  Dr Stefano Gaul is a 77 y.o. male who presents today for routine electrophysiology followup.  Since last being seen in our clinic, the patient reports doing very well.  He has recovered from AAA surgery in September.  Today, he denies symptoms of palpitations, chest pain, shortness of breath,  lower extremity edema, dizziness, presyncope, or syncope.  The patient is otherwise without complaint today.   Past Medical History  Diagnosis Date  . Hypertension   . Hypercholesterolemia   . Arrhythmia     cardiac arrythmia  . AAA (abdominal aortic aneurysm)     followed by Dr [name]  . Sleep disorder   . Sinus node dysfunction   . Irregular heart beat    Past Surgical History  Procedure Laterality Date  . Tonsillectomy    . Tibia fracture surgery      repair  . Pacemaker insertion  12/10/09    MDT Adaptal L implanted by Dr [name]  . Aortagram  06/09/12    Abdominal Aortagram  . Fracture surgery  1942    Broken leg Right   . Appendectomy  1947  . Abdominal aortic aneurysm repair  08/05/2012    Procedure: ANEURYSM ABDOMINAL AORTIC REPAIR;  Surgeon: Nada Libman, MD;  Location: [facility] OR;  Service: Vascular;  Laterality: N/A;  Open Abdominal Aortic  Aneurysm Repair and Renal Artery Bypass times 2  . Femoral artery exploration  08/05/2012    Procedure: FEMORAL ARTERY EXPLORATION;  Surgeon: Nada Libman, MD;  Location: [facility] OR;  Service: Vascular;  Laterality: N/A;  . Abdominal aortic aneurysm repair  08/05/12    Current Outpatient Prescriptions  Medication Sig Dispense Refill  . aspirin 81 MG EC tablet Take 81 mg by mouth daily.        . fexofenadine (ALLEGRA) 180 MG tablet Take 180 mg by mouth daily.      . hydrochlorothiazide (HYDRODIURIL) 25 MG tablet Take 12.5 mg by mouth daily.      Marland Kitchen lisinopril (PRINIVIL,ZESTRIL) 20 MG tablet Take 20 mg by mouth daily.       Marland Kitchen lovastatin (MEVACOR) 40 MG tablet Take 40 mg by mouth at bedtime.        . metoprolol succinate (TOPROL-XL) 25 MG 24 hr tablet Take 1 tablet (25 mg total) by mouth daily.  60 tablet  6  . Multiple Vitamins-Minerals (MULTIVITAMIN WITH MINERALS) tablet Take 1 tablet by mouth daily.       . Tamsulosin HCl (FLOMAX) 0.4 MG CAPS Take 0.4 mg by mouth at bedtime.        No current facility-administered medications for this visit.    Physical Exam: Filed Vitals:   03/28/13 1342  BP: 121/77  Pulse: 66  Height: 5\' 10"  (1.778 m)  Weight: 197 lb (89.359 kg)    GEN- The patient is well appearing, alert and oriented x 3 today.   Head- normocephalic, atraumatic Eyes-  Sclera clear, conjunctiva pink Ears- hearing intact Oropharynx- clear Lungs- Clear to ausculation bilaterally, normal work of breathing Chest- pacemaker pocket is well healed Heart- Regular rate and rhythm, no murmurs, rubs or gallops, PMI not laterally displaced GI- soft, NT, ND, + BS Extremities- no clubbing, cyanosis, or edema  Pacemaker interrogation- reviewed in detail today,  See PACEART report  Assessment and Plan:  1. Bradycardia Normal pacemaker function See Pace Art report No changes today  2. afib The patient continues to have episodic afib, longest episode 29  hours by PPM interrogation.  He is asymptomatic with this. His CHADS2VASC score is at least 3 (age, HTN, PVD).  He should be anticoagulated long term.  Today, I discussed coumadin and also novel anticoagulants including pradaxa, xarelto, and eliquis today as indicated for risk reduction in stroke and systemic emboli with nonvalvular atrial fibrillation.  Risks, benefits, and alternatives to each of these drugs were discussed at length today.  He would like to start eliquis. I will check BMET and CBC today and initiated eliquis 5mg  BID.  He will stop ASA.  Follow-up with Dr Antoine Poche as scheduled I will see again in 1 year

## 2013-04-08 ENCOUNTER — Encounter: Payer: Self-pay | Admitting: *Deleted

## 2013-05-30 ENCOUNTER — Ambulatory Visit: Payer: Medicare HMO | Admitting: Cardiology

## 2013-06-27 ENCOUNTER — Encounter: Payer: Medicare HMO | Admitting: *Deleted

## 2013-07-05 ENCOUNTER — Encounter: Payer: Self-pay | Admitting: *Deleted

## 2013-07-13 ENCOUNTER — Ambulatory Visit (INDEPENDENT_AMBULATORY_CARE_PROVIDER_SITE_OTHER): Payer: Medicare HMO | Admitting: *Deleted

## 2013-07-13 ENCOUNTER — Encounter: Payer: Self-pay | Admitting: Internal Medicine

## 2013-07-13 DIAGNOSIS — Z950 Presence of cardiac pacemaker: Secondary | ICD-10-CM

## 2013-07-13 DIAGNOSIS — I495 Sick sinus syndrome: Secondary | ICD-10-CM

## 2013-07-14 ENCOUNTER — Encounter: Payer: Self-pay | Admitting: Cardiology

## 2013-07-14 ENCOUNTER — Ambulatory Visit (INDEPENDENT_AMBULATORY_CARE_PROVIDER_SITE_OTHER): Payer: Medicare HMO | Admitting: Cardiology

## 2013-07-14 VITALS — BP 124/83 | HR 59 | Ht 70.0 in | Wt 186.0 lb

## 2013-07-14 DIAGNOSIS — I714 Abdominal aortic aneurysm, without rupture: Secondary | ICD-10-CM

## 2013-07-14 DIAGNOSIS — I4891 Unspecified atrial fibrillation: Secondary | ICD-10-CM

## 2013-07-14 DIAGNOSIS — I480 Paroxysmal atrial fibrillation: Secondary | ICD-10-CM

## 2013-07-14 DIAGNOSIS — I495 Sick sinus syndrome: Secondary | ICD-10-CM

## 2013-07-14 NOTE — Patient Instructions (Addendum)
Your physician recommends that you schedule a follow-up appointment in: as needed Your physician recommends that you continue on your current medications as directed. Please refer to the Current Medication list given to you today.   

## 2013-07-14 NOTE — Progress Notes (Signed)
HPI The patient presents for follow up of sinus node dysfunction pacemaker.   He is status post abdominal aortic aneurysm repair. He had a stress test last year prior to this. He has been noted on pacemaker followup to have atrial fibrillation and is now on Eliquis.  He has no symptoms related to this. He does get along relatively well doing and has started doing a little walking. He does not describe chest pressure , neck or arm discomfort. He does not describe palpitations, presyncope or syncope. He has no PND or orthopnea. He denies leg pain or swelling.  He denies any shortness of breath with activities. He has had no problems except for some minor bruising with his blood thinner..  No Known Allergies  Current Outpatient Prescriptions  Medication Sig Dispense Refill  . apixaban (ELIQUIS) 5 MG TABS tablet Take 1 tablet (5 mg total) by mouth 2 (two) times daily.  60 tablet  6  . hydrochlorothiazide (HYDRODIURIL) 25 MG tablet Take 12.5 mg by mouth daily.      Marland Kitchen lisinopril (PRINIVIL,ZESTRIL) 20 MG tablet Take 20 mg by mouth daily.       Marland Kitchen lovastatin (MEVACOR) 40 MG tablet Take 40 mg by mouth at bedtime.       . metoprolol succinate (TOPROL-XL) 25 MG 24 hr tablet Take 1 tablet (25 mg total) by mouth daily.  60 tablet  6  . Multiple Vitamins-Minerals (MULTIVITAMIN WITH MINERALS) tablet Take 1 tablet by mouth daily.       . Tamsulosin HCl (FLOMAX) 0.4 MG CAPS Take 0.4 mg by mouth at bedtime.       . triamcinolone (NASACORT) 55 MCG/ACT nasal inhaler Place 2 sprays into the nose daily.       No current facility-administered medications for this visit.    Past Medical History  Diagnosis Date  . Hypertension   . Hypercholesterolemia   . Arrhythmia     cardiac arrythmia  . AAA (abdominal aortic aneurysm)     followed by Dr [name]  . Sleep disorder   . Sinus node dysfunction   . Atrial fibrillation     Past Surgical History  Procedure Laterality Date  . Tonsillectomy    . Tibia  fracture surgery      repair  . Pacemaker insertion  12/10/09    MDT Adaptal L implanted by Dr [name]  . Aortagram  06/09/12    Abdominal Aortagram  . Fracture surgery  1942    Broken leg Right   . Appendectomy  1947  . Abdominal aortic aneurysm repair  08/05/2012    Procedure: ANEURYSM ABDOMINAL AORTIC REPAIR;  Surgeon: Nada Libman, MD;  Location: [facility] OR;  Service: Vascular;  Laterality: N/A;  Open Abdominal Aortic  Aneurysm Repair and Renal Artery Bypass times 2  . Femoral artery exploration  08/05/2012    Procedure: FEMORAL ARTERY EXPLORATION;  Surgeon: Nada Libman, MD;  Location: [facility] OR;  Service: Vascular;  Laterality: N/A;  . Abdominal aortic aneurysm repair  08/05/12    ROS:  As stated in the HPI and negative for all other systems.  PHYSICAL EXAM BP 124/83  Pulse 59  Ht 5\' 10"  (1.778 m)  Wt 186 lb (84.369 kg)  BMI 26.69 kg/m2 GENERAL:  Well appearing HEENT:  Pupils equal round and reactive, fundi not visualized, oral mucosa unremarkable , dentures NECK:  No jugular venous distention, waveform within normal limits, carotid upstroke brisk and symmetric, no bruits, no thyromegaly LYMPHATICS:  No cervical,  inguinal adenopathy LUNGS:  Clear to auscultation bilaterally BACK:  No CVA tenderness CHEST:  Well healed pacemaker pocket HEART:  PMI not displaced or sustained,S1 and S2 within normal limits, no S3, no S4, no clicks, no rubs, no murmurs ABD:  Flat, positive bowel sounds normal in frequency in pitch, no bruits, no rebound, no guarding,  Positive midline pulsatile mass, no hepatomegaly, no splenomegaly, well healed abdominal scar EXT:  2 plus pulses throughout, no edema, no cyanosis no clubbing SKIN:  No rashes no nodules NEURO:  Cranial nerves II through XII grossly intact, motor grossly intact throughout PSYCH:  Cognitively intact, oriented to person place and time  EKG: Sinus rhythm , rate 59, premature ventricular contractions, demand ventricular pacemaker , left anterior  fascicular block , poor anterior R wave progression, nonspecific diffuse T wave flattening.  07/14/2013  ASSESSMENT AND PLAN  Sinus node dysfunction -   The patient is up to date with  followup with Dr. Johney Frame.  AAA -   He is status post repair.   Atrial fib -  The patient  tolerates this rhythm and rate control and anticoagulation. We will continue with the meds as listed.  HTN -  The blood pressure is at target. No change in medications is indicated. We will continue with therapeutic lifestyle changes (TLC).

## 2013-07-15 LAB — REMOTE PACEMAKER DEVICE
AL THRESHOLD: 0.5 V
RV LEAD THRESHOLD: 1 V

## 2013-08-03 ENCOUNTER — Encounter: Payer: Self-pay | Admitting: *Deleted

## 2013-10-04 ENCOUNTER — Other Ambulatory Visit: Payer: Self-pay | Admitting: Internal Medicine

## 2013-10-17 ENCOUNTER — Encounter: Payer: Self-pay | Admitting: Internal Medicine

## 2013-10-17 ENCOUNTER — Ambulatory Visit (INDEPENDENT_AMBULATORY_CARE_PROVIDER_SITE_OTHER): Payer: Medicare HMO | Admitting: *Deleted

## 2013-10-17 DIAGNOSIS — I495 Sick sinus syndrome: Secondary | ICD-10-CM

## 2013-10-17 LAB — MDC_IDC_ENUM_SESS_TYPE_REMOTE
Battery Impedance: 227 Ohm
Battery Remaining Longevity: 116 mo
Brady Statistic AP VP Percent: 26 %
Brady Statistic AS VP Percent: 13 %
Brady Statistic AS VS Percent: 7 %
Lead Channel Impedance Value: 518 Ohm
Lead Channel Sensing Intrinsic Amplitude: 22.4 mV
Lead Channel Setting Pacing Amplitude: 2 V
Lead Channel Setting Pacing Amplitude: 2.5 V
Lead Channel Setting Pacing Pulse Width: 0.4 ms

## 2013-11-02 ENCOUNTER — Encounter: Payer: Self-pay | Admitting: *Deleted

## 2013-11-29 IMAGING — CR DG CHEST 2V
2 series · 2 of 2 positions shown · non-contrast
Comparison: 12/11/2009

CLINICAL DATA: Hypertension, previous tobacco use

CHEST - 2 VIEW

[view not recorded (1 of 2)]
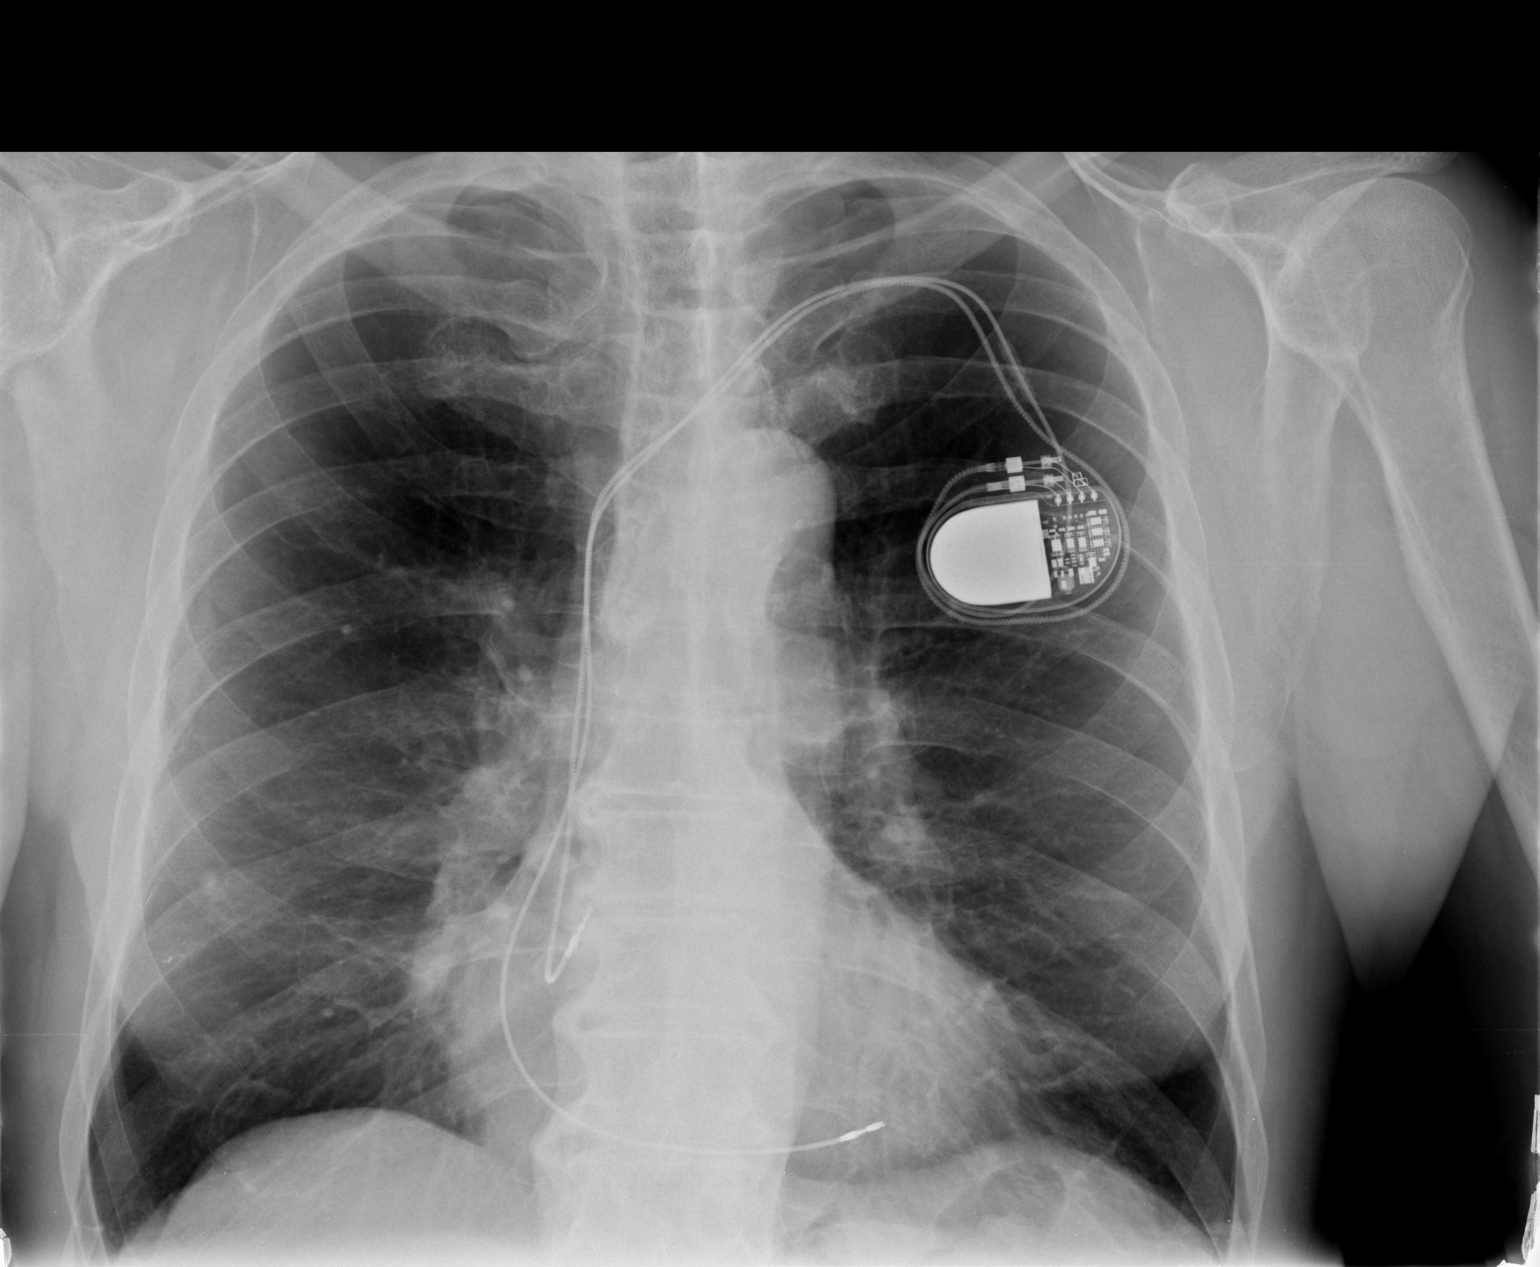

[view not recorded (2 of 2)]
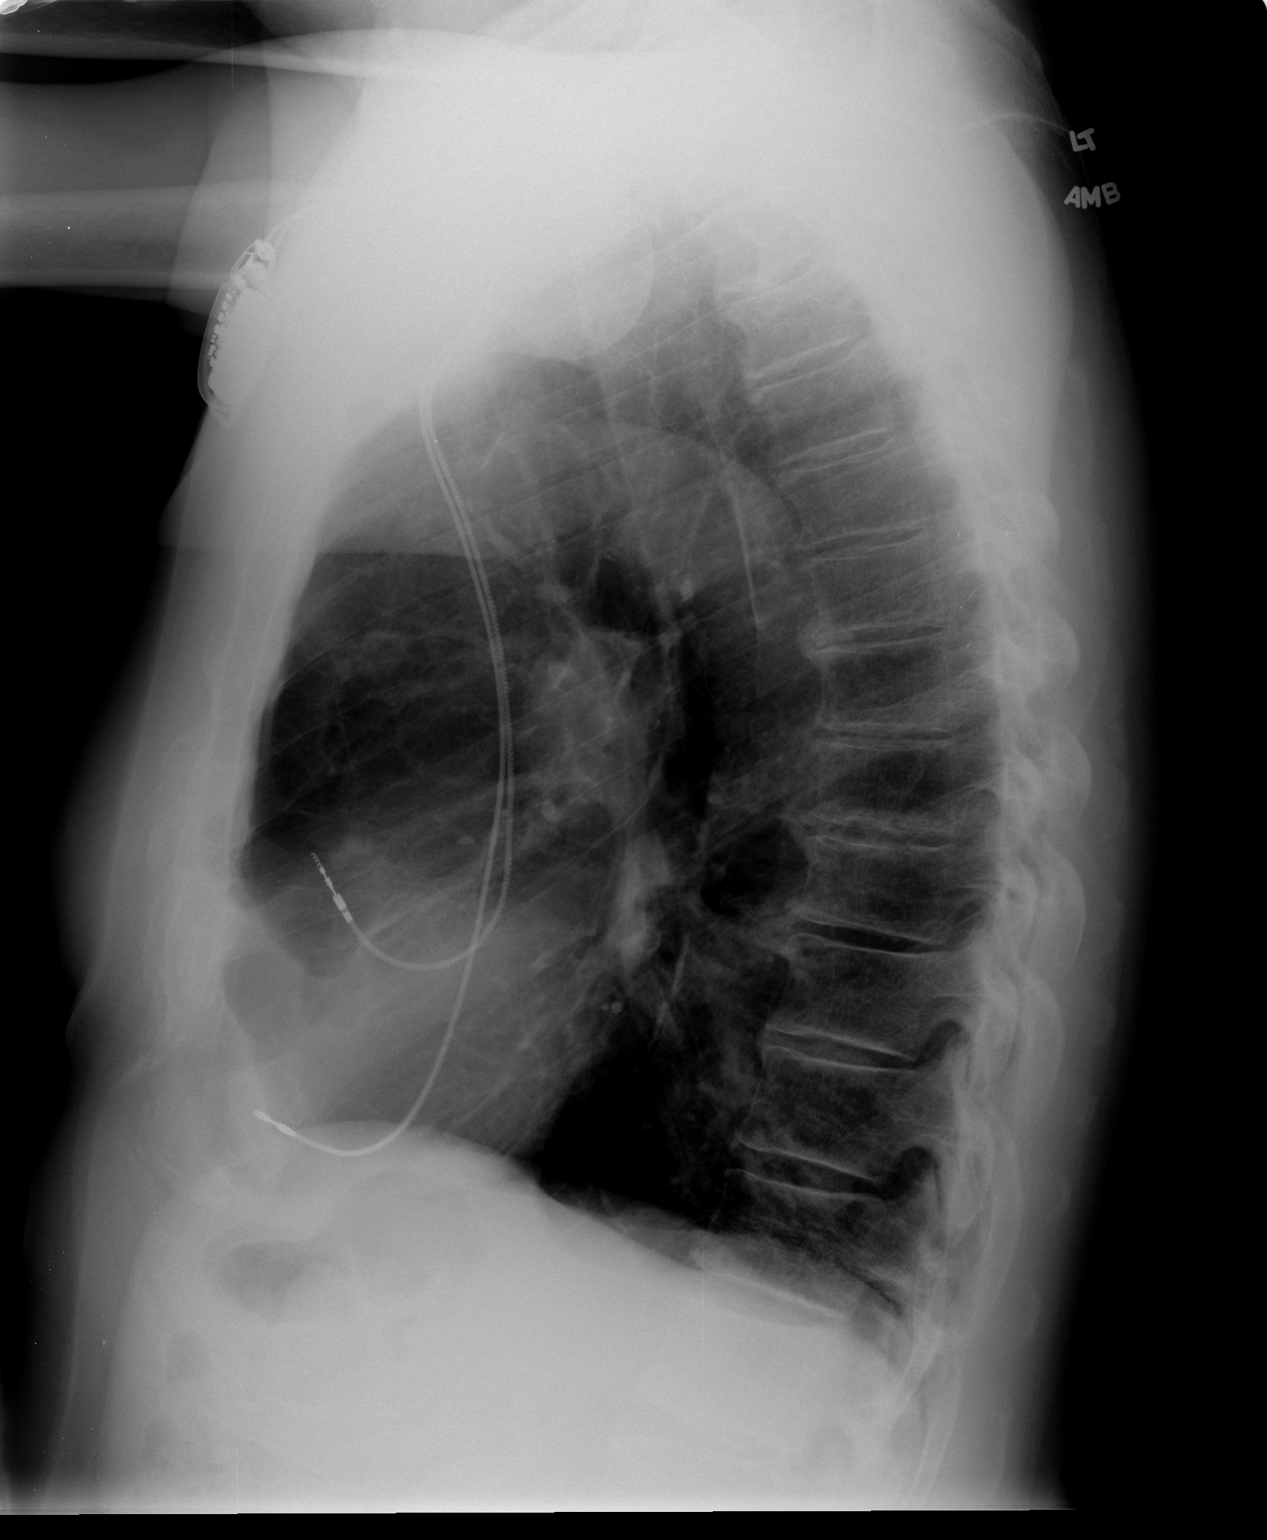

[2 of 2 positions shown; findings below may reference images not displayed]

FINDINGS: The cardiac shadow is stable.  A pacing device is again
noted.  The lungs are clear bilaterally.  Some calcified granulomas
are again seen.  Hyperinflation the lungs is noted.
IMPRESSION: COPD and prior granulomatous disease.  No acute abnormality is
noted.

## 2013-12-06 IMAGING — CR DG CHEST 1V PORT
1 series · 1 of 1 positions shown · non-contrast
Comparison: Two-view chest 07/29/2012.

CLINICAL DATA: Central line placement following repair of abdominal
aortic aneurysm.

PORTABLE CHEST - 1 VIEW

[AP]
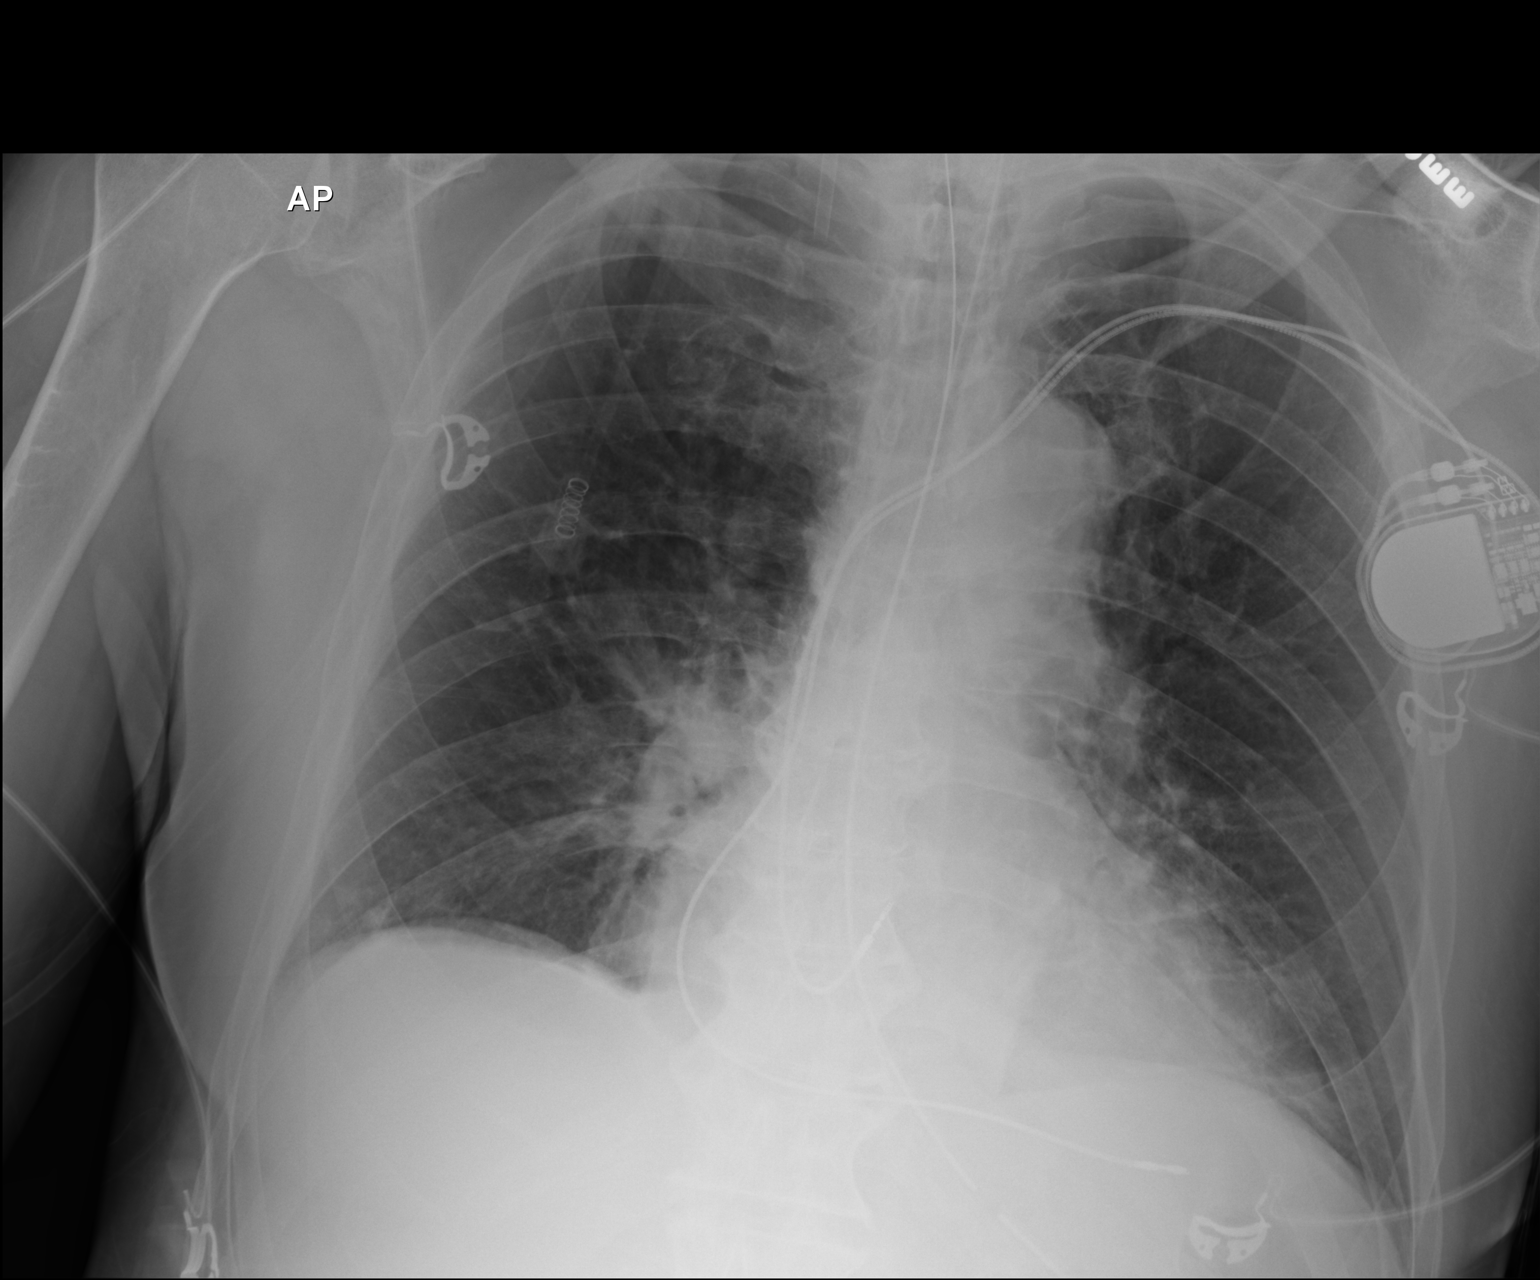

[1 of 1 positions shown; findings below may reference images not displayed]

FINDINGS: The heart is mildly enlarged.  Moderate pulmonary
vascular congestion is evident.  Lung volumes are low with mild
bibasilar atelectasis.  The patient is intubated.  Endotracheal
tube terminates 5.8 cm above the carina.  The side port the NG tube
is beyond the GE junction.  A right IJ sheath is in place.  There
is no pneumothorax.
IMPRESSION: 1.  This borderline cardiomegaly and moderate pulmonary vascular
congestion.
2.  Low lung volumes and bibasilar atelectasis.
3.  Support apparatus as detailed above.

## 2014-01-20 ENCOUNTER — Encounter: Payer: Self-pay | Admitting: Internal Medicine

## 2014-01-20 ENCOUNTER — Ambulatory Visit (INDEPENDENT_AMBULATORY_CARE_PROVIDER_SITE_OTHER): Payer: Medicare Other | Admitting: *Deleted

## 2014-01-20 DIAGNOSIS — I480 Paroxysmal atrial fibrillation: Secondary | ICD-10-CM

## 2014-01-20 DIAGNOSIS — I4891 Unspecified atrial fibrillation: Secondary | ICD-10-CM

## 2014-01-20 DIAGNOSIS — I495 Sick sinus syndrome: Secondary | ICD-10-CM

## 2014-01-20 LAB — MDC_IDC_ENUM_SESS_TYPE_REMOTE
Battery Remaining Longevity: 117 mo
Brady Statistic AP VP Percent: 25 %
Brady Statistic AS VS Percent: 8 %
Lead Channel Pacing Threshold Amplitude: 1.125 V
Lead Channel Pacing Threshold Pulse Width: 0.4 ms
Lead Channel Sensing Intrinsic Amplitude: 1 mV
Lead Channel Sensing Intrinsic Amplitude: 16 mV
Lead Channel Setting Pacing Amplitude: 2 V
Lead Channel Setting Pacing Pulse Width: 0.4 ms
Lead Channel Setting Sensing Sensitivity: 5.6 mV
MDC IDC MSMT BATTERY IMPEDANCE: 227 Ohm
MDC IDC MSMT BATTERY VOLTAGE: 2.78 V
MDC IDC MSMT LEADCHNL RA IMPEDANCE VALUE: 633 Ohm
MDC IDC MSMT LEADCHNL RA PACING THRESHOLD AMPLITUDE: 0.5 V
MDC IDC MSMT LEADCHNL RA PACING THRESHOLD PULSEWIDTH: 0.4 ms
MDC IDC MSMT LEADCHNL RV IMPEDANCE VALUE: 500 Ohm
MDC IDC SESS DTM: 20150220140546
MDC IDC SET LEADCHNL RV PACING AMPLITUDE: 2.5 V
MDC IDC STAT BRADY AP VS PERCENT: 56 %
MDC IDC STAT BRADY AS VP PERCENT: 11 %

## 2014-01-30 ENCOUNTER — Encounter: Payer: Self-pay | Admitting: *Deleted

## 2014-04-03 ENCOUNTER — Encounter: Payer: Self-pay | Admitting: Internal Medicine

## 2014-04-19 ENCOUNTER — Encounter: Payer: Self-pay | Admitting: Internal Medicine

## 2014-04-19 ENCOUNTER — Ambulatory Visit (INDEPENDENT_AMBULATORY_CARE_PROVIDER_SITE_OTHER): Payer: Medicare Other | Admitting: Internal Medicine

## 2014-04-19 VITALS — BP 148/84 | HR 85 | Ht 70.5 in | Wt 192.0 lb

## 2014-04-19 DIAGNOSIS — I4891 Unspecified atrial fibrillation: Secondary | ICD-10-CM

## 2014-04-19 DIAGNOSIS — I495 Sick sinus syndrome: Secondary | ICD-10-CM

## 2014-04-19 DIAGNOSIS — I480 Paroxysmal atrial fibrillation: Secondary | ICD-10-CM

## 2014-04-19 DIAGNOSIS — I10 Essential (primary) hypertension: Secondary | ICD-10-CM

## 2014-04-19 LAB — MDC_IDC_ENUM_SESS_TYPE_INCLINIC
Battery Impedance: 227 Ohm
Brady Statistic AP VP Percent: 25 %
Brady Statistic AS VP Percent: 9 %
Brady Statistic AS VS Percent: 8 %
Date Time Interrogation Session: 20150520173652
Lead Channel Impedance Value: 506 Ohm
Lead Channel Impedance Value: 611 Ohm
Lead Channel Pacing Threshold Amplitude: 0.5 V
Lead Channel Pacing Threshold Pulse Width: 0.4 ms
Lead Channel Sensing Intrinsic Amplitude: 11.2 mV
Lead Channel Sensing Intrinsic Amplitude: 4 mV
MDC IDC MSMT BATTERY REMAINING LONGEVITY: 117 mo
MDC IDC MSMT BATTERY VOLTAGE: 2.79 V
MDC IDC MSMT LEADCHNL RV PACING THRESHOLD AMPLITUDE: 1 V
MDC IDC MSMT LEADCHNL RV PACING THRESHOLD PULSEWIDTH: 0.4 ms
MDC IDC SET LEADCHNL RA PACING AMPLITUDE: 2 V
MDC IDC SET LEADCHNL RV PACING AMPLITUDE: 2.5 V
MDC IDC SET LEADCHNL RV PACING PULSEWIDTH: 0.4 ms
MDC IDC SET LEADCHNL RV SENSING SENSITIVITY: 5.6 mV
MDC IDC STAT BRADY AP VS PERCENT: 57 %

## 2014-04-19 NOTE — Progress Notes (Signed)
PCP: Glenda Chroman., MD Primary Cardiologist:  Dr Konrad Saha is a 78 y.o. male who presents today for routine electrophysiology followup.  Since last being seen in our clinic, the patient reports doing very well.  He is excited about moving to [place] to be with his children and grandchildren.  Today, he denies symptoms of palpitations, chest pain, shortness of breath,  lower extremity edema, dizziness, presyncope, or syncope.  The patient is otherwise without complaint today.   Past Medical History  Diagnosis Date  . Hypertension   . Hypercholesterolemia   . Arrhythmia     cardiac arrythmia  . AAA (abdominal aortic aneurysm)     followed by Dr Trula Slade  . Sleep disorder   . Sinus node dysfunction   . Atrial fibrillation    Past Surgical History  Procedure Laterality Date  . Tonsillectomy    . Tibia fracture surgery      repair  . Pacemaker insertion  12/10/09    MDT Adaptal L implanted by Dr Caryl Comes  . Aortagram  06/09/12    Abdominal Aortagram  . Fracture surgery  1942    Broken leg Right   . Appendectomy  1947  . Abdominal aortic aneurysm repair  08/05/2012    Procedure: ANEURYSM ABDOMINAL AORTIC REPAIR;  Surgeon: Serafina Mitchell, MD;  Location: [facility] OR;  Service: Vascular;  Laterality: N/A;  Open Abdominal Aortic  Aneurysm Repair and Renal Artery Bypass times 2  . Femoral artery exploration  08/05/2012    Procedure: FEMORAL ARTERY EXPLORATION;  Surgeon: Serafina Mitchell, MD;  Location: [facility] OR;  Service: Vascular;  Laterality: N/A;  . Abdominal aortic aneurysm repair  08/05/12    Current Outpatient Prescriptions  Medication Sig Dispense Refill  . ELIQUIS 5 MG TABS tablet TAKE 1 TABLET BY MOUTH TWICE DAILY  60 tablet  10  . hydrochlorothiazide (HYDRODIURIL) 25 MG tablet Take 12.5 mg by mouth daily.      Marland Kitchen lisinopril (PRINIVIL,ZESTRIL) 20 MG tablet Take 20 mg by mouth daily.       Marland Kitchen lovastatin (MEVACOR) 40 MG tablet Take 40 mg by mouth at bedtime.       . metoprolol succinate  (TOPROL-XL) 25 MG 24 hr tablet Take 1 tablet (25 mg total) by mouth daily.  60 tablet  6  . Multiple Vitamins-Minerals (MULTIVITAMIN WITH MINERALS) tablet Take 1 tablet by mouth daily.       . Tamsulosin HCl (FLOMAX) 0.4 MG CAPS Take 0.4 mg by mouth at bedtime.        No current facility-administered medications for this visit.    Physical Exam: Filed Vitals:   04/19/14 1657  BP: 148/84  Pulse: 85  Height: 5' 10.5" (1.791 m)  Weight: 192 lb (87.091 kg)    GEN- The patient is well appearing, alert and oriented x 3 today.   Head- normocephalic, atraumatic Eyes-  Sclera clear, conjunctiva pink Ears- hearing intact Oropharynx- clear Lungs- Clear to ausculation bilaterally, normal work of breathing Chest- pacemaker pocket is well healed Heart- Regular rate and rhythm, no murmurs, rubs or gallops, PMI not laterally displaced GI- soft, NT, ND, + BS Extremities- no clubbing, cyanosis, or edema  Pacemaker interrogation- reviewed in detail today,  See PACEART report  Assessment and Plan:  1. Sinus node dysfunction/ Bradycardia See Pace Art report No changes today  2. afib The patient continues to have episodic afib He is asymptomatic with this. His CHADS2VASC score is at least 3 (age, HTN, PVD).  Continue eliquis.  He will have records requested from our office when he moves to [place] Per his request, I did not schedule follow-up in our office

## 2014-04-19 NOTE — Patient Instructions (Signed)
Your physician recommends that you schedule a follow-up appointment as needed with Dr Allred   

## 2014-04-20 ENCOUNTER — Encounter: Payer: Self-pay | Admitting: Internal Medicine

## 2014-05-22 ENCOUNTER — Encounter: Payer: Self-pay | Admitting: Internal Medicine

## 2014-08-01 ENCOUNTER — Telehealth: Payer: Self-pay | Admitting: Internal Medicine

## 2014-08-01 NOTE — Telephone Encounter (Signed)
Would like to have a referral to a good to a physician in the following city  [place], [place]

## 2014-08-01 NOTE — Telephone Encounter (Signed)
[place] not [employer]

## 2014-08-01 NOTE — Telephone Encounter (Signed)
Patient informed that our office didn't know of any cardiologist personally but that he could check with his insurance company to see which cardiologists are in the areas.

## 2014-08-02 ENCOUNTER — Other Ambulatory Visit: Payer: Self-pay

## 2014-08-02 MED ORDER — APIXABAN 5 MG PO TABS: ORAL_TABLET | ORAL | Status: AC

## 2014-11-09 ENCOUNTER — Encounter (HOSPITAL_COMMUNITY): Payer: Self-pay | Admitting: Surgery

## 2015-03-05 ENCOUNTER — Encounter (HOSPITAL_COMMUNITY): Payer: Medicare HMO

## 2015-03-05 ENCOUNTER — Ambulatory Visit: Payer: Medicare HMO | Admitting: Family

## 2015-03-05 ENCOUNTER — Ambulatory Visit: Payer: Medicare HMO | Admitting: Surgery

## 2021-01-29 DEATH — deceased
